# Patient Record
Sex: Male | Born: 1945 | Race: White | Hispanic: No | Marital: Married | Smoking: Former smoker
Health system: Southern US, Community
[De-identification: ages and names within clinical notes are randomized; demographics above are authoritative.]

## PROBLEM LIST (undated history)

## (undated) DIAGNOSIS — E785 Hyperlipidemia, unspecified: Secondary | ICD-10-CM

## (undated) DIAGNOSIS — I10 Essential (primary) hypertension: Secondary | ICD-10-CM

## (undated) DIAGNOSIS — I700 Atherosclerosis of aorta: Secondary | ICD-10-CM

## (undated) DIAGNOSIS — E039 Hypothyroidism, unspecified: Secondary | ICD-10-CM

## (undated) DIAGNOSIS — I2510 Atherosclerotic heart disease of native coronary artery without angina pectoris: Secondary | ICD-10-CM

## (undated) HISTORY — DX: Essential (primary) hypertension: I10

## (undated) HISTORY — PX: OTHER SURGICAL HISTORY: SHX169

## (undated) HISTORY — DX: Atherosclerotic heart disease of native coronary artery without angina pectoris: I25.10

## (undated) HISTORY — DX: Atherosclerosis of aorta: I70.0

## (undated) HISTORY — DX: Hyperlipidemia, unspecified: E78.5

## (undated) HISTORY — PX: TONSILLECTOMY: SUR1361

---

## 1998-08-16 ENCOUNTER — Ambulatory Visit (HOSPITAL_COMMUNITY): Admission: RE | Admit: 1998-08-16 | Discharge: 1998-08-17 | Payer: Self-pay | Admitting: Cardiology

## 2012-05-06 ENCOUNTER — Encounter: Payer: Self-pay | Admitting: Cardiology

## 2012-05-06 ENCOUNTER — Ambulatory Visit (INDEPENDENT_AMBULATORY_CARE_PROVIDER_SITE_OTHER): Payer: Medicare Other | Admitting: Cardiology

## 2012-05-06 VITALS — BP 120/86 | HR 50 | Ht 71.0 in | Wt 189.0 lb

## 2012-05-06 DIAGNOSIS — I25810 Atherosclerosis of coronary artery bypass graft(s) without angina pectoris: Secondary | ICD-10-CM

## 2012-05-06 DIAGNOSIS — I2510 Atherosclerotic heart disease of native coronary artery without angina pectoris: Secondary | ICD-10-CM

## 2012-05-06 DIAGNOSIS — E785 Hyperlipidemia, unspecified: Secondary | ICD-10-CM

## 2012-05-06 NOTE — Patient Instructions (Addendum)
Your physician wants you to follow-up in: 1 YEAR with Dr Excell Seltzer.  You will receive a reminder letter in the mail two months in advance. If you don't receive a letter, please call our office to schedule the follow-up appointment.  Your physician has requested that you have an exercise tolerance test in 1 YEAR. For further information please visit [email or web address] Please also follow instruction sheet, as given.

## 2012-05-08 NOTE — Progress Notes (Signed)
   HPI:  He is  a very nice gentleman  Dr.Dhatt asked me to see.  There are friends, and he is a former patient. He previously underwent evaluation by Dr. Juanda Chance in the past.  The patient maintains an active lifestyle, playing both golf and tennis. He is retired he is been walking in [place] in [place]. He is no current restrictions. He needs a big meal he'll sometimes get indigestion. He's had no surgeries.  He has had cath in the past, and what we can see this was in 1999.  No other data available.  The information for the cath is not in the chart, EPIC or ECHART.     Current Outpatient Prescriptions  Medication Sig Dispense Refill  . ascorbic acid (VITAMIN C) 1000 MG tablet Take 1,000 mg by mouth daily.      Marland Kitchen aspirin 81 MG tablet Take 81 mg by mouth daily.      Marland Kitchen atorvastatin (LIPITOR) 80 MG tablet Take 80 mg by mouth daily.      . Coenzyme Q10 (CO Q10) 100 MG CAPS Take by mouth daily.      . fish oil-omega-3 fatty acids 1000 MG capsule Take 2 g by mouth daily.      Marland Kitchen GLUCOSAMINE SULFATE PO Take by mouth. Taking 1000 daily      . levothyroxine (SYNTHROID, LEVOTHROID) 100 MCG tablet Take 100 mcg by mouth daily.      . Misc Natural Products (PROSTATE SUPPORT PO) Take by mouth daily.      . Multiple Vitamin (MULTIVITAMIN) tablet Take 1 tablet by mouth daily.        No Known Allergies  Past Medical History  Diagnosis Date  . Coronary artery disease     Past Surgical History  Procedure Date  . Stents     No family history on file.  History   Social History  . Marital Status: Married    Spouse Name: N/A    Number of Children: N/A  . Years of Education: N/A   Occupational History  . Not on file.   Social History Main Topics  . Smoking status: Former Games developer  . Smokeless tobacco: Not on file  . Alcohol Use: Not on file  . Drug Use: Not on file  . Sexually Active: Not on file   Other Topics Concern  . Not on file   Social History Narrative  . No narrative on file     ROS: Please see the HPI.  All other systems reviewed and negative.  PHYSICAL EXAM:  BP 120/86  Pulse 50  Ht 5\' 11"  (1.803 m)  Wt 189 lb (85.73 kg)  BMI 26.36 kg/m2  General: Well developed, well nourished, in no acute distress. Head:  Normocephalic and atraumatic. Neck: no JVD Lungs: Clear to auscultation and percussion. Heart: Normal S1 and S2.  No murmur, rubs or gallops.  Abdomen:  Normal bowel sounds; soft; non tender; no organomegaly Pulses: Pulses normal in all 4 extremities. Extremities: No clubbing or cyanosis. No edema. Neurologic: Alert and oriented x 3.  EKG:  SB.  Otherwise normal.     ASSESSMENT AND PLAN:  We will need to get the information from his prior cath so that we know what was previously found.  We will need to get old records which are in archives.

## 2012-05-31 DIAGNOSIS — E785 Hyperlipidemia, unspecified: Secondary | ICD-10-CM | POA: Insufficient documentation

## 2012-05-31 NOTE — Assessment & Plan Note (Signed)
Currently on lipid lowering therapy.

## 2012-08-30 DIAGNOSIS — I2510 Atherosclerotic heart disease of native coronary artery without angina pectoris: Secondary | ICD-10-CM | POA: Insufficient documentation

## 2013-04-27 ENCOUNTER — Ambulatory Visit (INDEPENDENT_AMBULATORY_CARE_PROVIDER_SITE_OTHER): Payer: Medicare Other | Admitting: Cardiovascular Disease

## 2013-04-27 ENCOUNTER — Encounter: Payer: Self-pay | Admitting: Cardiovascular Disease

## 2013-04-27 VITALS — BP 130/74 | HR 49 | Ht 71.0 in | Wt 192.0 lb

## 2013-04-27 DIAGNOSIS — I2510 Atherosclerotic heart disease of native coronary artery without angina pectoris: Secondary | ICD-10-CM

## 2013-04-27 DIAGNOSIS — R079 Chest pain, unspecified: Secondary | ICD-10-CM

## 2013-04-27 NOTE — Patient Instructions (Signed)
Your physician has requested that you have an exercise stress myoview. For further information please visit [email or web address]. Please follow instruction sheet, as given.  Your physician recommends that you continue on your current medications as directed. Please refer to the Current Medication list given to you today.  Your physician wants you to follow-up in: 1 YEAR with Dr Cooper.  You will receive a reminder letter in the mail two months in advance. If you don't receive a letter, please call our office to schedule the follow-up appointment.  

## 2013-04-27 NOTE — Progress Notes (Signed)
   HPI:  67 year old gentleman presenting for followup evaluation. The patient has been followed for coronary artery disease. He underwent remote PCI in the 1990s. He has known chronic occlusion of his LAD and has undergone PTCA of his diagonal branches. He's had preserved left ventricular function. He's been managed medically for many years. He saw Dr. Riley Kill one year ago.  The patient maintains an active lifestyle. He plays singles tennis. Over the past year, he is noted some progression of exertional angina especially after meals. He has a feeling of pressure across the chest that he describes as "indigestion." He has no dyspnea with exertion, palpitations, lightheadedness, leg swelling, or syncope. He has not taken sublingual nitroglycerin.  Outpatient Encounter Prescriptions as of 04/27/2013  Medication Sig Dispense Refill  . ascorbic acid (VITAMIN C) 1000 MG tablet Take 1,000 mg by mouth daily.      Marland Kitchen aspirin 81 MG tablet Take 81 mg by mouth daily.      Marland Kitchen atorvastatin (LIPITOR) 80 MG tablet Take 80 mg by mouth daily.      . Coenzyme Q10 (CO Q10) 100 MG CAPS Take by mouth daily.      . fish oil-omega-3 fatty acids 1000 MG capsule Take 2 g by mouth daily.      . fluticasone (FLONASE) 50 MCG/ACT nasal spray as needed.      Marland Kitchen GLUCOSAMINE SULFATE PO Take by mouth. Taking 1000 daily      . levothyroxine (SYNTHROID, LEVOTHROID) 100 MCG tablet Take 100 mcg by mouth daily.      . Misc Natural Products (PROSTATE SUPPORT PO) Take by mouth daily.      . Multiple Vitamin (MULTIVITAMIN) tablet Take 1 tablet by mouth daily.       No facility-administered encounter medications on file as of 04/27/2013.    No Known Allergies  Past Medical History  Diagnosis Date  . Coronary artery disease     ROS: Negative except as per HPI  BP 130/74  Pulse 49  Ht 5\' 11"  (1.803 m)  Wt 87.091 kg (192 lb)  BMI 26.79 kg/m2  PHYSICAL EXAM: Pt is alert and oriented, NAD HEENT: normal Neck: JVP - normal,  carotids 2+= without bruits Lungs: CTA bilaterally CV: RRR without murmur or gallop Abd: soft, NT, Positive BS, no hepatomegaly Ext: no C/C/E, distal pulses intact and equal Skin: warm/dry no rash  EKG:   Marked sinus bradycardia 49 beats per minute, otherwise within normal limits.  ASSESSMENT AND PLAN: 1. 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 The patient is on aspirin and aggressive lipid lowering. He is not on any antianginal drugs. I have reviewed his paper chart which includes some of his cardiac cath notes. His coronary anatomy has shown patency of the stent in the proximal right coronary artery with mild in-stent restenosis, diagonal in-stent restenosis treated with PTCA, and total occlusion of the LAD beyond the diagonal. The LAD is collateralized from the distal right coronary artery. With progressive symptoms, I have recommended an exercise stress Myoview scan to evaluate overall ischemic burden and risk stratification. Will likely antianginal therapy and consider cardiac catheterization if study is high risk.  2. Hyperlipidemia. Followed by his primary care physician. He is on aggressive program.  For followup we will schedule him in one year. Will be in touch after his stress test.  Tonny Bollman 04/27/2013 9:04 AM

## 2013-05-05 ENCOUNTER — Ambulatory Visit (HOSPITAL_COMMUNITY): Payer: Medicare Other | Attending: Cardiology | Admitting: Radiology

## 2013-05-05 VITALS — BP 142/77 | HR 50 | Ht 71.0 in | Wt 195.0 lb

## 2013-05-05 DIAGNOSIS — I4949 Other premature depolarization: Secondary | ICD-10-CM

## 2013-05-05 DIAGNOSIS — E785 Hyperlipidemia, unspecified: Secondary | ICD-10-CM | POA: Insufficient documentation

## 2013-05-05 DIAGNOSIS — R079 Chest pain, unspecified: Secondary | ICD-10-CM

## 2013-05-05 DIAGNOSIS — Z87891 Personal history of nicotine dependence: Secondary | ICD-10-CM | POA: Insufficient documentation

## 2013-05-05 DIAGNOSIS — I2510 Atherosclerotic heart disease of native coronary artery without angina pectoris: Secondary | ICD-10-CM

## 2013-05-05 DIAGNOSIS — Z9861 Coronary angioplasty status: Secondary | ICD-10-CM | POA: Insufficient documentation

## 2013-05-05 DIAGNOSIS — Z8249 Family history of ischemic heart disease and other diseases of the circulatory system: Secondary | ICD-10-CM | POA: Insufficient documentation

## 2013-05-05 DIAGNOSIS — R0789 Other chest pain: Secondary | ICD-10-CM | POA: Insufficient documentation

## 2013-05-05 MED ORDER — TECHNETIUM TC 99M SESTAMIBI GENERIC - CARDIOLITE
30.0000 | Freq: Once | INTRAVENOUS | Status: AC | PRN
  Administered 2013-05-05: 30 via INTRAVENOUS

## 2013-05-05 MED ORDER — TECHNETIUM TC 99M SESTAMIBI GENERIC - CARDIOLITE
10.0000 | Freq: Once | INTRAVENOUS | Status: AC | PRN
  Administered 2013-05-05: 10 via INTRAVENOUS

## 2013-05-05 NOTE — Progress Notes (Signed)
[facility] SITE 3 NUCLEAR MED [address] [place], [place] [address] [phone]    Cardiology Nuclear Med Study  Austin Odom is a 67 y.o. male     MRN : [id number]     DOB: [birth year]  Procedure Date: 05/05/2013  Nuclear Med Background Indication for Stress Test:  Evaluation for Ischemia and PTCA/Stent Patency  History:  '99 PTCA/Stent-Diag.; ~10 yrs ago MPS ([place]):normal; 3-4 yrs ago GXT:ok per patient Cardiac Risk Factors: Family History - CAD, History of Smoking and Lipids  Symptoms:  Chest Pressure.  (last episode of chest discomfort was about 2-days ago)   Nuclear Pre-Procedure Caffeine/Decaff Intake:  None NPO After: 7:30pm   Lungs:  Clear. O2 Sat: 98% on room air. IV 0.9% NS with Angio Cath:  22g  IV Site: R Antecubital  IV Started by:  Austin Levan, RN  Chest Size (in):  44 Cup Size: n/a  Height: 5\' 11"  (1.803 m)  Weight:  195 lb (88.451 kg)  BMI:  Body mass index is 27.21 kg/(m^2). Tech Comments:  N/A    Nuclear Med Study 1 or 2 day study: 1 day  Stress Test Type:  Stress  Reading MD: Austin Ancona, MD  Order Authorizing Provider:  Tonny Bollman, MD  Resting Radionuclide: Technetium 90m Sestamibi  Resting Radionuclide Dose: 11.0 mCi   Stress Radionuclide:  Technetium 69m Sestamibi  Stress Radionuclide Dose: 33.0 mCi           Stress Protocol Rest HR: 50 Stress HR: 134  Rest BP: 142/77 Stress BP: 209/70  Exercise Time (min): 10:00 METS: 11.7   Predicted Max HR: 153 bpm % Max HR: 87.58 bpm Rate Pressure Product: 21308   Dose of Adenosine (mg):  n/a Dose of Lexiscan: n/a mg  Dose of Atropine (mg): n/a Dose of Dobutamine: n/a mcg/kg/min (at max HR)  Stress Test Technologist: Austin Odom, CMA-N  Nuclear Technologist:  Austin Odom, CNMT     Rest Procedure:  Myocardial perfusion imaging was performed at rest 45 minutes following the intravenous administration of Technetium 69m Sestamibi.  Rest ECG: NSR - Normal EKG  Stress  Procedure:  The patient exercised on the treadmill utilizing the Bruce Protocol for ten minutes. The patient stopped due to fatigue and denied any chest pain.  Technetium 75m Sestamibi was injected at peak exercise and myocardial perfusion imaging was performed after a brief delay.  Stress ECG: 1 mm horizontal ST depression leads V4 and V5 resolved quickly in recovery.   QPS Raw Data Images:  Normal; no motion artifact; normal heart/lung ratio. Stress Images:  Normal homogeneous uptake in all areas of the myocardium. Rest Images:  Normal homogeneous uptake in all areas of the myocardium. Subtraction (SDS):  There is no evidence of scar or ischemia. Transient Ischemic Dilatation (Normal <1.22):  1.06 Lung/Heart Ratio (Normal <0.45):  0.34  Quantitative Gated Spect Images QGS EDV:  93 ml QGS ESV:  39 ml  Impression Exercise Capacity:  Good exercise capacity. BP Response:  Hypertensive blood pressure response. Clinical Symptoms:  Fatigue, no chest pain.  ECG Impression:  1 mm horizontal ST depression in leads V4 and V5 at peak exercise resolved quickly in recovery.  Comparison with Prior Nuclear Study: No images to compare  Overall Impression:  Normal stress nuclear study.  Equivocal stress ECG response probably nonspecific given rapid resolution of ST segments in recovery.   LV Ejection Fraction: 58%.  LV Wall Motion:  NL LV Function; NL Wall Motion  Austin Odom  Austin Odom 05/05/2013

## 2013-05-24 ENCOUNTER — Other Ambulatory Visit: Payer: Self-pay

## 2013-05-24 MED ORDER — ISOSORBIDE MONONITRATE ER 30 MG PO TB24: 30.0000 mg | ORAL_TABLET | Freq: Every day | ORAL | Status: DC

## 2014-01-07 ENCOUNTER — Ambulatory Visit (INDEPENDENT_AMBULATORY_CARE_PROVIDER_SITE_OTHER): Payer: Medicare Other | Admitting: Cardiovascular Disease

## 2014-01-07 ENCOUNTER — Encounter: Payer: Self-pay | Admitting: Cardiovascular Disease

## 2014-01-07 VITALS — BP 139/73 | HR 49 | Ht 71.0 in | Wt 187.0 lb

## 2014-01-07 DIAGNOSIS — R55 Syncope and collapse: Secondary | ICD-10-CM

## 2014-01-07 DIAGNOSIS — I2510 Atherosclerotic heart disease of native coronary artery without angina pectoris: Secondary | ICD-10-CM

## 2014-01-07 DIAGNOSIS — E785 Hyperlipidemia, unspecified: Secondary | ICD-10-CM

## 2014-01-07 NOTE — Patient Instructions (Addendum)
Your physician has recommended that you wear an event monitor. Event monitors are medical devices that record the heart's electrical activity. Doctors most often us these monitors to diagnose arrhythmias. Arrhythmias are problems with the speed or rhythm of the heartbeat. The monitor is a small, portable device. You can wear one while you do your normal daily activities. This is usually used to diagnose what is causing palpitations/syncope (passing out).   Your physician wants you to follow-up in: 1 year with Dr. Excell Seltzerooper.  You will receive a reminder letter in the mail two months in advance. If you don't receive a letter, please call our office to schedule the follow-up appointment.  Your physician recommends that you continue on your current medications as directed. Please refer to the Current Medication list given to you today.

## 2014-01-07 NOTE — Progress Notes (Signed)
HPI:  68 year old gentleman presenting for followup evaluation. The patient has been followed for coronary artery disease. He underwent remote PCI in the 1990s. He has known chronic occlusion of his LAD and has undergone PTCA of his diagonal branches. He's had preserved left ventricular function. He's been managed medically for many years.  The patient reports a recent episode of syncope. This occurred about 2 weeks in the morning after sexual intercourse. He denied use of alcohol or meds for E.D. Had prodrome of dizziness/tunnel vision. No chest pain, dyspnea, or other symptoms. Had similar episode about 7 years ago with negative workup at that time. He remains physically quite active and denies any symptoms with exertion.   Outpatient Encounter Prescriptions as of 01/07/2014  Medication Sig  . ascorbic acid (VITAMIN C) 1000 MG tablet Take 1,000 mg by mouth daily.  Marland Kitchen aspirin 81 MG tablet Take 81 mg by mouth daily.  Marland Kitchen atorvastatin (LIPITOR) 80 MG tablet Take 80 mg by mouth daily.  . Coenzyme Q10 (CO Q10) 100 MG CAPS Take by mouth daily.  . fish oil-omega-3 fatty acids 1000 MG capsule Take 2 g by mouth daily.  . fluticasone (FLONASE) 50 MCG/ACT nasal spray as needed.  Marland Kitchen GLUCOSAMINE SULFATE PO Take by mouth. Taking 1000 daily  . levothyroxine (SYNTHROID, LEVOTHROID) 100 MCG tablet Take 100 mcg by mouth daily.  . Misc Natural Products (PROSTATE SUPPORT PO) Take by mouth daily.  . Multiple Vitamin (MULTIVITAMIN) tablet Take 1 tablet by mouth daily.  . [DISCONTINUED] isosorbide mononitrate (IMDUR) 30 MG 24 hr tablet Take 1 tablet (30 mg total) by mouth daily.    No Known Allergies  Past Medical History  Diagnosis Date  . Coronary artery disease     ROS: Negative except as per HPI  BP 139/73  Pulse 49  Ht 5\' 11"  (1.803 m)  Wt 187 lb (84.823 kg)  BMI 26.09 kg/m2  PHYSICAL EXAM: Pt is alert and oriented, NAD HEENT: normal Neck: JVP - normal, carotids 2+= without bruits Lungs: CTA  bilaterally CV: bradycardic and regular without murmur or gallop Abd: soft, NT, Positive BS, no hepatomegaly Ext: no C/C/E, distal pulses intact and equal Skin: warm/dry no rash  EKG:  Marked sinus bradycardia 48 bpm, otherwise within normal limits.  Stress Myoview 05/05/2013: Stress Protocol  Rest HR: 50  Stress HR: 134   Rest BP: 142/77  Stress BP: 209/70   Exercise Time (min): 10:00  METS: 11.7   Predicted Max HR: 153 bpm  % Max HR: 87.58 bpm  Rate Pressure Product: [address]  Dose of Adenosine (mg): n/a  Dose of Lexiscan: n/a mg   Dose of Atropine (mg): n/a  Dose of Dobutamine: n/a mcg/kg/min (at max HR)   Stress Test Technologist: Smiley Houseman, CMA-N  Nuclear Technologist: Domenic Polite, CNMT   Rest Procedure: Myocardial perfusion imaging was performed at rest 45 minutes following the intravenous administration of Technetium 61m Sestamibi.  Rest ECG: NSR - Normal EKG  Stress Procedure: The patient exercised on the treadmill utilizing the Bruce Protocol for ten minutes. The patient stopped due to fatigue and denied any chest pain. Technetium 53m Sestamibi was injected at peak exercise and myocardial perfusion imaging was performed after a brief delay.  Stress ECG: 1 mm horizontal ST depression leads V4 and V5 resolved quickly in recovery.  QPS  Raw Data Images: Normal; no motion artifact; normal heart/lung ratio.  Stress Images: Normal homogeneous uptake in all areas of the myocardium.  Rest Images: Normal  homogeneous uptake in all areas of the myocardium.  Subtraction (SDS): There is no evidence of scar or ischemia.  Transient Ischemic Dilatation (Normal <1.22): 1.06  Lung/Heart Ratio (Normal <0.45): 0.34  Quantitative Gated Spect Images  QGS EDV: 93 ml  QGS ESV: 39 ml  Impression  Exercise Capacity: Good exercise capacity.  BP Response: Hypertensive blood pressure response.  Clinical Symptoms: Fatigue, no chest pain.  ECG Impression: 1 mm horizontal ST depression in leads  V4 and V5 at peak exercise resolved quickly in recovery.  Comparison with Prior Nuclear Study: No images to compare  Overall Impression: Normal stress nuclear study. Equivocal stress ECG response probably nonspecific given rapid resolution of ST segments in recovery.  LV Ejection Fraction: 58%. LV Wall Motion: NL LV Function; NL Wall Motion  ASSESSMENT AND PLAN: 1. CAD, native vessel. No anginal symptoms with regular exercise. Continue current medical therapy.  2. Hyperlipidemia: lipids followed by Dr Tiburcio PeaHarris. Remains on atorvastatin 80 mg daily.   3. Syncope. Diff dx includes arrhythmia versus hypotension as most likely explanation. The patient does have marked bradycardia at rest, but suspect related to peripheral vasodilation/hypotension considering circumstances. Recommend 30 day event monitor to evaluate heart rhythm.  I'll see him back in one year as long as no further syncopal spells and benign monitor results.   Tonny BollmanMichael Neng Albee 01/09/2014 9:35 PM

## 2014-01-20 ENCOUNTER — Encounter: Payer: Self-pay | Admitting: *Deleted

## 2014-01-20 ENCOUNTER — Encounter (INDEPENDENT_AMBULATORY_CARE_PROVIDER_SITE_OTHER): Payer: Medicare Other

## 2014-01-20 DIAGNOSIS — R55 Syncope and collapse: Secondary | ICD-10-CM

## 2014-01-20 NOTE — Progress Notes (Signed)
Patient ID: Austin AbedDavid Neal Odom, male   DOB: 04/08/1946, 68 y.o.   MRN: [id number] E-cardio verite 30 day cardiac event monitor applied to patient.

## 2014-02-11 ENCOUNTER — Telehealth: Payer: Self-pay | Admitting: Nurse Practitioner

## 2014-02-11 NOTE — Telephone Encounter (Signed)
eCardio results of sinus rhythm/sinus tachycardia, no arrhythmia per Dr. Excell Seltzerooper called to patient who verbalized understanding. Patient aware to call office with questions or concerns.

## 2015-01-24 ENCOUNTER — Ambulatory Visit (INDEPENDENT_AMBULATORY_CARE_PROVIDER_SITE_OTHER): Payer: Medicare Other | Admitting: Cardiovascular Disease

## 2015-01-24 ENCOUNTER — Encounter: Payer: Self-pay | Admitting: Cardiovascular Disease

## 2015-01-24 VITALS — BP 130/72 | HR 53 | Ht 71.0 in | Wt 187.4 lb

## 2015-01-24 DIAGNOSIS — I2510 Atherosclerotic heart disease of native coronary artery without angina pectoris: Secondary | ICD-10-CM

## 2015-01-24 DIAGNOSIS — E785 Hyperlipidemia, unspecified: Secondary | ICD-10-CM | POA: Diagnosis not present

## 2015-01-24 NOTE — Patient Instructions (Signed)
Your physician wants you to follow-up in: 1 YEAR with Dr Cooper.  You will receive a reminder letter in the mail two months in advance. If you don't receive a letter, please call our office to schedule the follow-up appointment.  Your physician recommends that you continue on your current medications as directed. Please refer to the Current Medication list given to you today.  

## 2015-01-24 NOTE — Progress Notes (Signed)
Cardiology Office Note   Date:  01/24/2015   ID:  Austin Odom, DOB 10/22/1946, MRN [id number]  PCP:  Johny BlamerHARRIS, WILLIAM, MD  Cardiologist:  Tonny BollmanMichael Toma Erichsen, MD    No chief complaint on file.    History of Present Illness: Austin Odom is a 69 y.o. male who presents for follow-up of CAD.   He underwent remote PCI in the 1990s. He has known chronic occlusion of his LAD and has undergone PTCA of his diagonal branches. He's had preserved left ventricular function. He's been managed medically for many years. The patient had a normal stress Myoview in 2014. An event monitor in 2015 was performed to evaluate an episode of syncope. This revealed no arrhythmia.  He is doing quite well. He continues to play tennis regularly. He's had a right Achilles injury, but no cardiac symptoms. He specifically denies chest pain, chest pressure, shortness of breath, or heart palpitations. He's had 2 episodes of syncope. Both have occurred after sexual intercourse. No associated symptoms or other complaints.   Past Medical History  Diagnosis Date  . Coronary artery disease     Past Surgical History  Procedure Laterality Date  . Stents      Current Outpatient Prescriptions  Medication Sig Dispense Refill  . ascorbic acid (VITAMIN C) 1000 MG tablet Take 1,000 mg by mouth daily.    Marland Kitchen. aspirin 81 MG tablet Take 81 mg by mouth daily.    Marland Kitchen. atorvastatin (LIPITOR) 80 MG tablet Take 80 mg by mouth daily.    . cetirizine (ZYRTEC) 10 MG tablet Take 1 tablet by mouth as needed for allergies (take one tablet by mouth daily as needed for allergies).   1  . Coenzyme Q10 (CO Q10) 100 MG CAPS Take 1 capsule by mouth daily.     . fish oil-omega-3 fatty acids 1000 MG capsule Take 2 g by mouth daily.    . fluticasone (FLONASE) 50 MCG/ACT nasal spray Place 2 sprays into both nostrils as needed for allergies (take 2 sprays each nostril twice daily as needed for seasonal allergies).     Marland Kitchen. GLUCOSAMINE SULFATE PO Take 1  capsule by mouth daily. Taking 1000 daily    . levothyroxine (SYNTHROID, LEVOTHROID) 100 MCG tablet Take 100 mcg by mouth daily before breakfast.     . meloxicam (MOBIC) 7.5 MG tablet Take 1 tablet by mouth as needed. Take one tablet by mouth as needed for pain  0  . Misc Natural Products (PROSTATE SUPPORT PO) Take 1 capsule by mouth daily.     . Multiple Vitamin (MULTIVITAMIN) tablet Take 1 tablet by mouth daily.     No current facility-administered medications for this visit.    Allergies:   Review of patient's allergies indicates no known allergies.   Social History:  The patient  reports that he has quit smoking. He does not have any smokeless tobacco history on file.   Family History:  The patient's family history includes Heart disease in his father; Hypothyroidism in his mother; Stroke in his mother.    ROS:  Please see the history of present illness.  Otherwise, review of systems is positive for right ankle pain.  All other systems are reviewed and negative.    PHYSICAL EXAM: VS:  BP 130/72 mmHg  Pulse 53  Ht 5\' 11"  (1.803 m)  Wt 187 lb 6.4 oz (85.004 kg)  BMI 26.15 kg/m2 , BMI Body mass index is 26.15 kg/(m^2). GEN: Well nourished, well developed, in no  acute distress HEENT: normal Neck: no JVD, no masses, no carotid bruits Cardiac: Bradycardic and regular without murmur or gallop   Respiratory:  clear to auscultation bilaterally, normal work of breathing GI: soft, nontender, nondistended, + BS MS: no deformity or atrophy Ext: no pretibial edema Skin: warm and dry, no rash Neuro:  Strength and sensation are intact Psych: euthymic mood, full affect  EKG:  EKG is ordered today. The ekg ordered today shows sinus bradycardia 53 bpm, within normal limits.  Recent Labs: No results found for requested labs within last 365 days.   Lipid Panel  No results found for: CHOL, TRIG, HDL, CHOLHDL, VLDL, LDLCALC, LDLDIRECT    Wt Readings from Last 3 Encounters:  01/24/15 187  lb 6.4 oz (85.004 kg)  01/07/14 187 lb (84.823 kg)  05/05/13 195 lb (88.451 kg)     Cardiac Studies Reviewed: Myoview without ischemia: 2014 Event Monitor: no arrhythmia: 2015  ASSESSMENT AND PLAN: 1.  CAD, native vessel, without symptoms of angina: continue same Rx. RTN 1 year. Medications appropriate with ASA and a statin drug.   2. Hyperlipidemia: remains on high-dose atorvastatin. Followed by Dr Tiburcio Pea. Recent labs done.   3. Syncope: appears to be situational. Advised liberalize fluids and salt to maintain intravascular volume. Event monitor without arrhythmia. Normal LV function documented in past.   Current medicines are reviewed with the patient today.  The patient does not have concerns regarding medicines.  The following changes have been made:  no change  Labs/ tests ordered today include:  No orders of the defined types were placed in this encounter.    Disposition:   FU one year  Signed, Tonny Bollman, MD  01/24/2015 9:00 AM    [facility] [address] [place], [place], [place]  [address] Phone: [phone]; Fax: [phone]

## 2015-05-01 ENCOUNTER — Other Ambulatory Visit: Payer: Self-pay | Admitting: Family Medicine

## 2015-05-01 DIAGNOSIS — R079 Chest pain, unspecified: Secondary | ICD-10-CM

## 2015-05-02 ENCOUNTER — Ambulatory Visit
Admission: RE | Admit: 2015-05-02 | Discharge: 2015-05-02 | Disposition: A | Discharge status: Home / Self Care | Payer: Medicare Other | Source: Ambulatory Visit | Attending: Family Medicine | Admitting: Family Medicine

## 2015-05-02 DIAGNOSIS — R079 Chest pain, unspecified: Secondary | ICD-10-CM

## 2015-05-02 MED ORDER — IOPAMIDOL (ISOVUE-300) INJECTION 61%
75.0000 mL | Freq: Once | INTRAVENOUS | Status: AC | PRN
  Administered 2015-05-02: 75 mL via INTRAVENOUS

## 2015-06-07 ENCOUNTER — Other Ambulatory Visit: Payer: Self-pay | Admitting: Orthopedic Surgery

## 2015-08-11 ENCOUNTER — Encounter (HOSPITAL_BASED_OUTPATIENT_CLINIC_OR_DEPARTMENT_OTHER): Payer: Self-pay | Admitting: *Deleted

## 2015-08-17 ENCOUNTER — Ambulatory Visit (HOSPITAL_BASED_OUTPATIENT_CLINIC_OR_DEPARTMENT_OTHER): Payer: Medicare Other | Admitting: Certified Registered"

## 2015-08-17 ENCOUNTER — Encounter (HOSPITAL_BASED_OUTPATIENT_CLINIC_OR_DEPARTMENT_OTHER)
Admission: RE | Disposition: A | Discharge status: Home / Self Care | Payer: Self-pay | Source: Ambulatory Visit | Attending: Orthopedic Surgery

## 2015-08-17 ENCOUNTER — Encounter (HOSPITAL_BASED_OUTPATIENT_CLINIC_OR_DEPARTMENT_OTHER): Payer: Self-pay | Admitting: Certified Registered"

## 2015-08-17 ENCOUNTER — Ambulatory Visit (HOSPITAL_BASED_OUTPATIENT_CLINIC_OR_DEPARTMENT_OTHER)
Admission: RE | Admit: 2015-08-17 | Discharge: 2015-08-17 | Disposition: A | Discharge status: Home / Self Care | Payer: Medicare Other | Source: Ambulatory Visit | Attending: Orthopedic Surgery | Admitting: Orthopedic Surgery

## 2015-08-17 DIAGNOSIS — M7661 Achilles tendinitis, right leg: Secondary | ICD-10-CM | POA: Diagnosis not present

## 2015-08-17 DIAGNOSIS — I2510 Atherosclerotic heart disease of native coronary artery without angina pectoris: Secondary | ICD-10-CM | POA: Diagnosis not present

## 2015-08-17 DIAGNOSIS — Z87891 Personal history of nicotine dependence: Secondary | ICD-10-CM | POA: Insufficient documentation

## 2015-08-17 DIAGNOSIS — E039 Hypothyroidism, unspecified: Secondary | ICD-10-CM | POA: Insufficient documentation

## 2015-08-17 DIAGNOSIS — Z7982 Long term (current) use of aspirin: Secondary | ICD-10-CM | POA: Insufficient documentation

## 2015-08-17 DIAGNOSIS — M6701 Short Achilles tendon (acquired), right ankle: Secondary | ICD-10-CM | POA: Diagnosis not present

## 2015-08-17 DIAGNOSIS — M9261 Juvenile osteochondrosis of tarsus, right ankle: Secondary | ICD-10-CM | POA: Diagnosis not present

## 2015-08-17 HISTORY — PX: EXCISION HAGLUND'S DEFORMITY WITH ACHILLES TENDON REPAIR: SHX5627

## 2015-08-17 HISTORY — PX: GASTROC RECESSION EXTREMITY: SHX6262

## 2015-08-17 HISTORY — DX: Hypothyroidism, unspecified: E03.9

## 2015-08-17 SURGERY — EXCISION HAGLUND'S DEFORMITY WITH ACHILLES TENDON REPAIR
Anesthesia: General | Site: Leg Lower | Laterality: Right

## 2015-08-17 MED ORDER — 0.9 % SODIUM CHLORIDE (POUR BTL) OPTIME
TOPICAL | Status: DC | PRN
  Administered 2015-08-17: 240 mL

## 2015-08-17 MED ORDER — EPHEDRINE SULFATE 50 MG/ML IJ SOLN
INTRAMUSCULAR | Status: AC
  Filled 2015-08-17: qty 1

## 2015-08-17 MED ORDER — ASPIRIN EC 325 MG PO TBEC: 325.0000 mg | DELAYED_RELEASE_TABLET | Freq: Every day | ORAL | Status: DC

## 2015-08-17 MED ORDER — FENTANYL CITRATE (PF) 100 MCG/2ML IJ SOLN
50.0000 ug | INTRAMUSCULAR | Status: DC | PRN
  Administered 2015-08-17: 100 ug via INTRAVENOUS

## 2015-08-17 MED ORDER — CEFAZOLIN SODIUM-DEXTROSE 2-3 GM-% IV SOLR
2.0000 g | INTRAVENOUS | Status: AC
  Administered 2015-08-17: 2 g via INTRAVENOUS

## 2015-08-17 MED ORDER — DEXAMETHASONE SODIUM PHOSPHATE 4 MG/ML IJ SOLN
INTRAMUSCULAR | Status: DC | PRN
  Administered 2015-08-17: 10 mg via INTRAVENOUS

## 2015-08-17 MED ORDER — SCOPOLAMINE 1 MG/3DAYS TD PT72: 1.0000 | MEDICATED_PATCH | Freq: Once | TRANSDERMAL | Status: DC | PRN

## 2015-08-17 MED ORDER — MIDAZOLAM HCL 2 MG/2ML IJ SOLN
INTRAMUSCULAR | Status: AC
  Filled 2015-08-17: qty 2

## 2015-08-17 MED ORDER — SODIUM CHLORIDE 0.9 % IV SOLN: INTRAVENOUS | Status: DC

## 2015-08-17 MED ORDER — LACTATED RINGERS IV SOLN
INTRAVENOUS | Status: DC
  Administered 2015-08-17 (×2): via INTRAVENOUS

## 2015-08-17 MED ORDER — DOCUSATE SODIUM 100 MG PO CAPS: 100.0000 mg | ORAL_CAPSULE | Freq: Two times a day (BID) | ORAL | Status: DC

## 2015-08-17 MED ORDER — CEFAZOLIN SODIUM-DEXTROSE 2-3 GM-% IV SOLR
INTRAVENOUS | Status: AC
  Filled 2015-08-17: qty 50

## 2015-08-17 MED ORDER — EPHEDRINE SULFATE 50 MG/ML IJ SOLN
INTRAMUSCULAR | Status: DC | PRN
  Administered 2015-08-17: 10 mg via INTRAVENOUS

## 2015-08-17 MED ORDER — ONDANSETRON HCL 4 MG/2ML IJ SOLN
INTRAMUSCULAR | Status: DC | PRN
Start: 2015-08-17 — End: 2015-08-17
  Administered 2015-08-17: 4 mg via INTRAVENOUS

## 2015-08-17 MED ORDER — LIDOCAINE HCL (CARDIAC) 20 MG/ML IV SOLN
INTRAVENOUS | Status: DC | PRN
  Administered 2015-08-17: 30 mg via INTRAVENOUS

## 2015-08-17 MED ORDER — LIDOCAINE HCL (CARDIAC) 20 MG/ML IV SOLN
INTRAVENOUS | Status: AC
  Filled 2015-08-17: qty 5

## 2015-08-17 MED ORDER — PROPOFOL 10 MG/ML IV BOLUS
INTRAVENOUS | Status: DC | PRN
  Administered 2015-08-17: 200 mg via INTRAVENOUS

## 2015-08-17 MED ORDER — PROPOFOL 500 MG/50ML IV EMUL
INTRAVENOUS | Status: AC
  Filled 2015-08-17: qty 50

## 2015-08-17 MED ORDER — GLYCOPYRROLATE 0.2 MG/ML IJ SOLN: 0.2000 mg | Freq: Once | INTRAMUSCULAR | Status: DC | PRN

## 2015-08-17 MED ORDER — SUCCINYLCHOLINE CHLORIDE 20 MG/ML IJ SOLN
INTRAMUSCULAR | Status: DC | PRN
  Administered 2015-08-17: 100 mg via INTRAVENOUS

## 2015-08-17 MED ORDER — OXYCODONE HCL 5 MG PO TABS: 5.0000 mg | ORAL_TABLET | ORAL | Status: DC | PRN

## 2015-08-17 MED ORDER — SUCCINYLCHOLINE CHLORIDE 20 MG/ML IJ SOLN
INTRAMUSCULAR | Status: AC
  Filled 2015-08-17: qty 1

## 2015-08-17 MED ORDER — FENTANYL CITRATE (PF) 100 MCG/2ML IJ SOLN
INTRAMUSCULAR | Status: AC
  Filled 2015-08-17: qty 2

## 2015-08-17 MED ORDER — MIDAZOLAM HCL 2 MG/2ML IJ SOLN
1.0000 mg | INTRAMUSCULAR | Status: DC | PRN
  Administered 2015-08-17: 2 mg via INTRAVENOUS

## 2015-08-17 MED ORDER — SENNA 8.6 MG PO TABS: 2.0000 | ORAL_TABLET | Freq: Two times a day (BID) | ORAL | Status: DC

## 2015-08-17 MED ORDER — FENTANYL CITRATE (PF) 100 MCG/2ML IJ SOLN
INTRAMUSCULAR | Status: AC
  Filled 2015-08-17: qty 4

## 2015-08-17 MED ORDER — ONDANSETRON HCL 4 MG/2ML IJ SOLN
INTRAMUSCULAR | Status: AC
  Filled 2015-08-17: qty 2

## 2015-08-17 MED ORDER — CHLORHEXIDINE GLUCONATE 4 % EX LIQD: 60.0000 mL | Freq: Once | CUTANEOUS | Status: DC

## 2015-08-17 MED ORDER — DEXAMETHASONE SODIUM PHOSPHATE 10 MG/ML IJ SOLN
INTRAMUSCULAR | Status: AC
  Filled 2015-08-17: qty 1

## 2015-08-17 SURGICAL SUPPLY — 74 items
BANDAGE ESMARK 6X9 LF (GAUZE/BANDAGES/DRESSINGS) ×2 IMPLANT
BLADE AVERAGE 25X9 (BLADE) IMPLANT
BLADE MICRO SAGITTAL (BLADE) ×3 IMPLANT
BLADE SURG 15 STRL LF DISP TIS (BLADE) ×4 IMPLANT
BLADE SURG 15 STRL SS (BLADE) ×2
BNDG COHESIVE 4X5 TAN STRL (GAUZE/BANDAGES/DRESSINGS) ×3 IMPLANT
BNDG COHESIVE 6X5 TAN STRL LF (GAUZE/BANDAGES/DRESSINGS) ×3 IMPLANT
BNDG ESMARK 6X9 LF (GAUZE/BANDAGES/DRESSINGS) ×3
BOOT STEPPER DURA LG (SOFTGOODS) IMPLANT
BOOT STEPPER DURA MED (SOFTGOODS) IMPLANT
CANISTER SUCT 1200ML W/VALVE (MISCELLANEOUS) ×3 IMPLANT
CHLORAPREP W/TINT 26ML (MISCELLANEOUS) ×3 IMPLANT
COVER BACK TABLE 60X90IN (DRAPES) ×3 IMPLANT
CUFF TOURNIQUET SINGLE 34IN LL (TOURNIQUET CUFF) ×3 IMPLANT
DRAPE EXTREMITY T 121X128X90 (DRAPE) ×3 IMPLANT
DRAPE OEC MINIVIEW 54X84 (DRAPES) IMPLANT
DRAPE U-SHAPE 47X51 STRL (DRAPES) ×3 IMPLANT
DRSG MEPITEL 4X7.2 (GAUZE/BANDAGES/DRESSINGS) ×3 IMPLANT
DRSG PAD ABDOMINAL 8X10 ST (GAUZE/BANDAGES/DRESSINGS) ×6 IMPLANT
ELECT REM PT RETURN 9FT ADLT (ELECTROSURGICAL) ×3
ELECTRODE REM PT RTRN 9FT ADLT (ELECTROSURGICAL) ×2 IMPLANT
GAUZE SPONGE 4X4 12PLY STRL (GAUZE/BANDAGES/DRESSINGS) ×3 IMPLANT
GLOVE BIO SURGEON STRL SZ8 (GLOVE) ×3 IMPLANT
GLOVE BIOGEL PI IND STRL 7.0 (GLOVE) ×2 IMPLANT
GLOVE BIOGEL PI IND STRL 8 (GLOVE) ×4 IMPLANT
GLOVE BIOGEL PI INDICATOR 7.0 (GLOVE) ×1
GLOVE BIOGEL PI INDICATOR 8 (GLOVE) ×2
GLOVE ECLIPSE 6.5 STRL STRAW (GLOVE) ×3 IMPLANT
GLOVE ECLIPSE 7.5 STRL STRAW (GLOVE) ×3 IMPLANT
GLOVE EXAM NITRILE MD LF STRL (GLOVE) ×3 IMPLANT
GOWN STRL REUS W/ TWL LRG LVL3 (GOWN DISPOSABLE) ×2 IMPLANT
GOWN STRL REUS W/ TWL XL LVL3 (GOWN DISPOSABLE) ×4 IMPLANT
GOWN STRL REUS W/TWL LRG LVL3 (GOWN DISPOSABLE) ×1
GOWN STRL REUS W/TWL XL LVL3 (GOWN DISPOSABLE) ×2
KIT BIO-TENODESIS 3X8 DISP (MISCELLANEOUS)
KIT INSRT BABSR STRL DISP BTN (MISCELLANEOUS) IMPLANT
NDL SAFETY ECLIPSE 18X1.5 (NEEDLE) IMPLANT
NEEDLE HYPO 18GX1.5 SHARP (NEEDLE)
NEEDLE HYPO 22GX1.5 SAFETY (NEEDLE) IMPLANT
NS IRRIG 1000ML POUR BTL (IV SOLUTION) ×3 IMPLANT
PACK ACHILLES SUTUREBRIDGE (Anchor) ×3 IMPLANT
PACK BASIN DAY SURGERY FS (CUSTOM PROCEDURE TRAY) ×3 IMPLANT
PAD CAST 4YDX4 CTTN HI CHSV (CAST SUPPLIES) ×2 IMPLANT
PADDING CAST ABS 4INX4YD NS (CAST SUPPLIES) ×1
PADDING CAST ABS COTTON 4X4 ST (CAST SUPPLIES) ×2 IMPLANT
PADDING CAST COTTON 4X4 STRL (CAST SUPPLIES) ×1
PADDING CAST COTTON 6X4 STRL (CAST SUPPLIES) ×3 IMPLANT
PENCIL BUTTON HOLSTER BLD 10FT (ELECTRODE) ×3 IMPLANT
SANITIZER HAND PURELL 535ML FO (MISCELLANEOUS) ×3 IMPLANT
SHEET MEDIUM DRAPE 40X70 STRL (DRAPES) ×3 IMPLANT
SLEEVE SCD COMPRESS KNEE MED (MISCELLANEOUS) ×3 IMPLANT
SPLINT FAST PLASTER 5X30 (CAST SUPPLIES) ×20
SPLINT PLASTER CAST FAST 5X30 (CAST SUPPLIES) ×40 IMPLANT
SPONGE LAP 18X18 X RAY DECT (DISPOSABLE) ×3 IMPLANT
STAPLER VISISTAT 35W (STAPLE) IMPLANT
STOCKINETTE 6  STRL (DRAPES) ×1
STOCKINETTE 6 STRL (DRAPES) ×2 IMPLANT
SUCTION FRAZIER TIP 10 FR DISP (SUCTIONS) ×3 IMPLANT
SUT ETHIBOND 2 OS 4 DA (SUTURE) IMPLANT
SUT ETHIBOND 3-0 V-5 (SUTURE) IMPLANT
SUT ETHILON 3 0 PS 1 (SUTURE) ×3 IMPLANT
SUT FIBERWIRE #2 38 T-5 BLUE (SUTURE)
SUT MNCRL AB 3-0 PS2 18 (SUTURE) ×3 IMPLANT
SUT MNCRL AB 4-0 PS2 18 (SUTURE) IMPLANT
SUT VIC AB 0 CT1 27 (SUTURE)
SUT VIC AB 0 CT1 27XBRD ANBCTR (SUTURE) IMPLANT
SUT VIC AB 0 SH 27 (SUTURE) IMPLANT
SUT VIC AB 2-0 SH 27 (SUTURE) ×1
SUT VIC AB 2-0 SH 27XBRD (SUTURE) ×2 IMPLANT
SUTURE FIBERWR #2 38 T-5 BLUE (SUTURE) IMPLANT
SYR BULB 3OZ (MISCELLANEOUS) ×3 IMPLANT
TOWEL OR 17X24 6PK STRL BLUE (TOWEL DISPOSABLE) ×3 IMPLANT
TUBE CONNECTING 20X1/4 (TUBING) IMPLANT
UNDERPAD 30X30 (UNDERPADS AND DIAPERS) ×3 IMPLANT

## 2015-08-17 NOTE — Anesthesia Procedure Notes (Addendum)
Procedure Name: Intubation Date/Time: 08/17/2015 9:32 AM Performed by: BLOCKER, TIMOTHY D Pre-anesthesia Checklist: Patient identified, Emergency Drugs available, Suction available and Patient being monitored Patient Re-evaluated:Patient Re-evaluated prior to inductionOxygen Delivery Method: Circle System Utilized Preoxygenation: Pre-oxygenation with 100% oxygen Intubation Type: IV induction Ventilation: Mask ventilation without difficulty Laryngoscope Size: Mac and 3 Grade View: Grade II Tube type: Oral Tube size: 7.0 mm Number of attempts: 1 Airway Equipment and Method: Stylet and Oral airway Placement Confirmation: ETT inserted through vocal cords under direct vision,  positive ETCO2 and breath sounds checked- equal and bilateral Secured at: 21 cm Tube secured with: Tape Dental Injury: Teeth and Oropharynx as per pre-operative assessment    Anesthesia Regional Block:  Popliteal block  Pre-Anesthetic Checklist: ,, timeout performed, Correct Patient, Correct Site, Correct Laterality, Correct Procedure, Correct Position, site marked, Risks and benefits discussed,  Surgical consent,  Pre-op evaluation,  At surgeon's request and post-op pain management  Laterality: Right  Prep: chloraprep       Needles:   Needle Type: Stimulator Needle - 80          Additional Needles:  Procedures: Doppler guided, ultrasound guided (picture in chart) and nerve stimulator Popliteal block  Nerve Stimulator or Paresthesia:  Response: 0.5 mA,   Additional Responses:   Narrative:  Injection made incrementally with aspirations every 5 mL.  Performed by: Personally  Anesthesiologist: Judie Petit

## 2015-08-17 NOTE — Anesthesia Preprocedure Evaluation (Signed)
Anesthesia Evaluation  Patient identified by MRN, date of birth, ID band Patient awake    Reviewed: Allergy & Precautions, NPO status , Patient's Chart, lab work & pertinent test results  Airway Mallampati: II  TM Distance: >3 FB Neck ROM: Full    Dental   Pulmonary former smoker,    breath sounds clear to auscultation       Cardiovascular + CAD   Rhythm:Regular Rate:Normal     Neuro/Psych    GI/Hepatic negative GI ROS, Neg liver ROS,   Endo/Other  Hypothyroidism   Renal/GU negative Renal ROS     Musculoskeletal   Abdominal   Peds  Hematology   Anesthesia Other Findings   Reproductive/Obstetrics                             Anesthesia Physical Anesthesia Plan  ASA: III  Anesthesia Plan: General   Post-op Pain Management:    Induction: Intravenous  Airway Management Planned: LMA  Additional Equipment:   Intra-op Plan:   Post-operative Plan: Extubation in OR  Informed Consent: I have reviewed the patients History and Physical, chart, labs and discussed the procedure including the risks, benefits and alternatives for the proposed anesthesia with the patient or authorized representative who has indicated his/her understanding and acceptance.   Dental advisory given  Plan Discussed with: CRNA and Anesthesiologist  Anesthesia Plan Comments:         Anesthesia Quick Evaluation

## 2015-08-17 NOTE — Discharge Instructions (Addendum)
Austin Hewitt, MD °[facility] ° °Please read the following information regarding your care after surgery. ° °Medications  °You only need a prescription for the narcotic pain medicine (ex. oxycodone, Percocet, Norco).  All of the other medicines listed below are available over the counter. °X acetominophen (Tylenol) 650 mg every 4-6 hours as you need for minor pain °X oxycodone as prescribed for moderate to severe pain °?  ° °Narcotic pain medicine (ex. oxycodone, Percocet, Vicodin) will cause constipation.  To prevent this problem, take the following medicines while you are taking any pain medicine. °X docusate sodium (Colace) 100 mg twice a day X senna (Senokot) 2 tablets twice a day ° °X To help prevent blood clots, take an aspirin (325 mg) once a day for a month after surgery.  You should also get up every hour while you are awake to move around.   ° °Weight Bearing °X Do not bear any weight on the operated leg or foot. ° °Cast / Splint / Dressing °X Keep your splint or cast clean and dry.  Don’t put anything (coat hanger, pencil, etc) down inside of it.  If it gets damp, use a hair dryer on the cool setting to dry it.  If it gets soaked, call the office to schedule an appointment for a cast change. ° ° °After your dressing, cast or splint is removed; you may shower, but do not soak or scrub the wound.  Allow the water to run over it, and then gently pat it dry. ° °Swelling °It is normal for you to have swelling where you had surgery.  To reduce swelling and pain, keep your toes above your nose for at least 3 days after surgery.  It may be necessary to keep your foot or leg elevated for several weeks.  If it hurts, it should be elevated. ° °Follow Up °Call my office at [phone] when you are discharged from the hospital or surgery center to schedule an appointment to be seen two weeks after surgery. ° °Call my office at [phone] if you develop a fever >101.5° F, nausea, vomiting, bleeding from  the surgical site or severe pain.   ° °Regional Anesthesia Blocks ° °1. Numbness or the inability to move the "blocked" extremity may last from 3-48 hours after placement. The length of time depends on the medication injected and your individual response to the medication. If the numbness is not going away after 48 hours, call your surgeon. ° °2. The extremity that is blocked will need to be protected until the numbness is gone and the  Strength has returned. Because you cannot feel it, you will need to take extra care to avoid injury. Because it may be weak, you may have difficulty moving it or using it. You may not know what position it is in without looking at it while the block is in effect. ° °3. For blocks in the legs and feet, returning to weight bearing and walking needs to be done carefully. You will need to wait until the numbness is entirely gone and the strength has returned. You should be able to move your leg and foot normally before you try and bear weight or walk. You will need someone to be with you when you first try to ensure you do not fall and possibly risk injury. ° °4. Bruising and tenderness at the needle site are common side effects and will resolve in a few days. ° °5. Persistent numbness or new problems with movement   should be communicated to the surgeon or the [facility] ([phone])/ [facility] ([phone]). ° °Post Anesthesia Home Care Instructions ° °Activity: °Get plenty of rest for the remainder of the day. A responsible adult should stay with you for 24 hours following the procedure.  °For the next 24 hours, DO NOT: °-Drive a car °-Operate machinery °-Drink alcoholic beverages °-Take any medication unless instructed by your physician °-Make any legal decisions or sign important papers. ° °Meals: °Start with liquid foods such as gelatin or soup. Progress to regular foods as tolerated. Avoid greasy, spicy, heavy foods. If nausea and/or vomiting occur,  drink only clear liquids until the nausea and/or vomiting subsides. Call your physician if vomiting continues. ° °Special Instructions/Symptoms: °Your throat may feel dry or sore from the anesthesia or the breathing tube placed in your throat during surgery. If this causes discomfort, gargle with warm salt water. The discomfort should disappear within 24 hours. ° °If you had a scopolamine patch placed behind your ear for the management of post- operative nausea and/or vomiting: ° °1. The medication in the patch is effective for 72 hours, after which it should be removed.  Wrap patch in a tissue and discard in the trash. Wash hands thoroughly with soap and water. °2. You may remove the patch earlier than 72 hours if you experience unpleasant side effects which may include dry mouth, dizziness or visual disturbances. °3. Avoid touching the patch. Wash your hands with soap and water after contact with the patch. °  ° °

## 2015-08-17 NOTE — Op Note (Signed)
Austin Odom NO.:  [id number]  MEDICAL RECORD NO.:  [id number]  LOCATION:                                 FACILITY:  PHYSICIAN:  Austin Arthurs, MD             DATE OF BIRTH:  DATE OF PROCEDURE:  08/17/2015 DATE OF DISCHARGE:                              OPERATIVE REPORT   PREOPERATIVE DIAGNOSES: 1. Right Achilles insertional tendinopathy. 2. Tight right heel cord. 3. Right calcaneus Haglund deformity.  POSTOPERATIVE DIAGNOSES: 1. Right Achilles insertional tendinopathy. 2. Tight right heel cord. 3. Right calcaneus Haglund deformity.  PROCEDURE: 1. Right gastrocnemius recession through a separate incision. 2. Excision of right calcaneus Haglund deformity. 3. Right Achilles tendon debridement and reconstruction.  SURGEON:  Austin Odom, M.D.  ASSISTANT:  Austin Martinez, PA-C.  ANESTHESIA:  General, regional.  ESTIMATED BLOOD LOSS:  Minimal.  TOURNIQUET TIME:  57 minutes at 250 mmHg.  COMPLICATIONS:  None apparent.  DISPOSITION:  Extubated, awake, and stable to recovery.  INDICATIONS FOR PROCEDURE:  The patient is a 69 year old male with a long history of right heel pain.  He has failed nonoperative treatment to date.  MRI and plain radiographs show insertional Achilles tendinopathy with a prominent Haglund deformity.  He also has a tight right heel cord.  He presents now for operative treatment of this painful condition.  He understands the risks and benefits, the alternative treatment options, and elects surgical treatment.  He specifically understands risks of bleeding, infection, nerve damage, blood clots, need for additional surgery, continued pain, amputation, and death.  PROCEDURE IN DETAIL:  After preoperative consent was obtained and the correct operative site was identified, the patient was brought to the operating room supine on the stretcher.  General anesthesia was induced. Preoperative antibiotics were administered.  Surgical  time-out was taken.  The right lower extremity was exsanguinated and a thigh tourniquet inflated to 250 mmHg.  The patient was then turned into the prone position on the operating table with all bony prominences padded well.  The right lower extremity was then prepped and draped in standard sterile fashion.  A longitudinal incision was then made at the gastrocnemius tendon.  Sharp dissection was carried down through skin and subcutaneous tissue.  Care was taken to protect the sural nerve and lesser saphenous vein.  The gastrocnemius tendon was identified.  It was divided in its entirety.  Wound was irrigated and closed with Monocryl and nylon.  Attention was then turned to the posterior aspect of the heel where longitudinal incision was made.  Sharp dissection was carried down through the skin and subcutaneous tissue and paratenon.  The paratenon was then elevated exposing the Achilles tendon.  The tendon was split longitudinally and elevated from its insertion at the calcaneus. Calcifications within the tendon were excised sharply.  The tendon was then debrided of all diseased and nonviable tissue.  This was done sharply with a #15 blade.  The insertional enthesophyte and Haglund deformity were then excised in their entirety with an oscillating saw. The cut surface of bone was smoothed with a rasp.  The wound was irrigated copiously.  The Achilles tendon  was then repaired back to the insertion on the calcaneus using the Arthrex suture bridge device.  This created an hourglass pattern of suture repairing the tendon to bone with 4 suture anchors.  Wound was then irrigated copiously.  The paratenon was repaired with inverted simple sutures of 2-0 Vicryl and 3-0 Monocryl.  Skin was closed with a running 3-0 nylon suture.  Sterile dressings were applied, followed by a well-padded short-leg splint. Tourniquet was released after application of the dressings at 57 minutes.  The patient was  awakened from anesthesia and transported to the recovery room in stable condition.  FOLLOWUP PLAN:  The patient will be nonweightbearing on the right lower extremity.  He will follow up with me in 2 weeks for suture removal and conversion to a CAM walker boot with a heel lift.  He will start aspirin for DVT prophylaxis.  Austin Martinez, PA-C was present and scrubbed for the duration of the case.  His assistance was essential in positioning the patient, prepping and draping, gaining and maintaining exposure, performing the operation, closing and dressing the wounds, and applying the splint.     Austin Arthurs, MD     JH/MEDQ  D:  08/17/2015  T:  08/17/2015  Job:  [id number]

## 2015-08-17 NOTE — Brief Op Note (Signed)
08/17/2015  10:56 AM  PATIENT:  Austin Odom  69 y.o. male  PRE-OPERATIVE DIAGNOSIS: 1.  Right achilles tendonitis      2.  Tight right heelcord      3.  Right calcaneus Haglund deformity  POST-OPERATIVE DIAGNOSIS:  same  Procedure(s): 1.  RIGHT ACHILLES DEBRIDEMENT AND RECONSTRUCTION 2.  EXCISION OF RIGHT HAGLUND'S DEFORMITY 3.  RIGHT GASTROC RECESSION separate incision  SURGEON:  Toni Arthurs, MD  ASSISTANT: Alfredo Martinez, PA-C  ANESTHESIA:   General, regional  EBL:  minimal   TOURNIQUET:   Total Tourniquet Time Documented: Thigh (Right) - 57 minutes Total: Thigh (Right) - 57 minutes   COMPLICATIONS:  None apparent  DISPOSITION:  Extubated, awake and stable to recovery.  DICTATION ID:  [id number]

## 2015-08-17 NOTE — Anesthesia Postprocedure Evaluation (Signed)
  Anesthesia Post-op Note  Patient: Austin Odom  Procedure(s) Performed: Procedure(s): RIGHT ACHILLES DEBRIDEMENT AND RECONSTRUCTION,  EXCISION OF HAGLUND'S DEFORMITY (Right) RIGHT GASTROC RECESSION (Right)  Patient Location: PACU  Anesthesia Type:General  Level of Consciousness: awake  Airway and Oxygen Therapy: Patient Spontanous Breathing  Post-op Pain: mild  Post-op Assessment: Post-op Vital signs reviewed     RLE Motor Response: Purposeful movement RLE Sensation: Full sensation      Post-op Vital Signs: Reviewed  Last Vitals:  Filed Vitals:   08/17/15 1150  BP: 162/66  Pulse: 65  Temp: 36.4 C  Resp: 20    Complications: No apparent anesthesia complications

## 2015-08-17 NOTE — Transfer of Care (Signed)
Immediate Anesthesia Transfer of Care Note  Patient: Austin Odom  Procedure(s) Performed: Procedure(s): RIGHT ACHILLES DEBRIDEMENT AND RECONSTRUCTION,  EXCISION OF HAGLUND'S DEFORMITY (Right) RIGHT GASTROC RECESSION (Right)  Patient Location: PACU  Anesthesia Type:GA combined with regional for post-op pain  Level of Consciousness: awake, alert , oriented and patient cooperative  Airway & Oxygen Therapy: Patient Spontanous Breathing and Patient connected to face mask oxygen  Post-op Assessment: Report given to RN and Post -op Vital signs reviewed and stable  Post vital signs: Reviewed and stable  Last Vitals:  Filed Vitals:   08/17/15 1047  BP: 167/68  Pulse:   Temp:   Resp:     Complications: No apparent anesthesia complications

## 2015-08-17 NOTE — Progress Notes (Signed)
Assisted Dr. Edwards with right, ultrasound guided, popliteal block. Side rails up, monitors on throughout procedure. See vital signs in flow sheet. Tolerated Procedure well. 

## 2015-08-17 NOTE — H&P (Signed)
Austin Odom is an 69 y.o. male.   Chief Complaint:  Right heel pain HPI: 69 y/o male with PMH of CAD c/o R heel pain for several years.  He has failed treatment so far and presents now for surgery.  Past Medical History  Diagnosis Date  . Coronary artery disease   . Hypothyroidism     Past Surgical History  Procedure Laterality Date  . Stents    . Tonsillectomy      Family History  Problem Relation Age of Onset  . Stroke Mother   . Hypothyroidism Mother   . Heart disease Father    Social History:  reports that he has quit smoking. He has never used smokeless tobacco. He reports that he drinks alcohol. He reports that he does not use illicit drugs.  Allergies: No Known Allergies  Medications Prior to Admission  Medication Sig Dispense Refill  . ascorbic acid (VITAMIN C) 1000 MG tablet Take 1,000 mg by mouth daily.    Marland Kitchen aspirin 81 MG tablet Take 81 mg by mouth daily.    Marland Kitchen atorvastatin (LIPITOR) 80 MG tablet Take 80 mg by mouth daily.    . cetirizine (ZYRTEC) 10 MG tablet Take 1 tablet by mouth as needed for allergies (take one tablet by mouth daily as needed for allergies).   1  . Coenzyme Q10 (CO Q10) 100 MG CAPS Take 1 capsule by mouth daily.     . fish oil-omega-3 fatty acids 1000 MG capsule Take 2 g by mouth daily.    . fluticasone (FLONASE) 50 MCG/ACT nasal spray Place 2 sprays into both nostrils as needed for allergies (take 2 sprays each nostril twice daily as needed for seasonal allergies).     Marland Kitchen GLUCOSAMINE SULFATE PO Take 1 capsule by mouth daily. Taking 1000 daily    . levothyroxine (SYNTHROID, LEVOTHROID) 100 MCG tablet Take 100 mcg by mouth daily before breakfast.     . meloxicam (MOBIC) 7.5 MG tablet Take 1 tablet by mouth as needed. Take one tablet by mouth as needed for pain  0    No results found for this or any previous visit (from the past 48 hour(s)). No results found.  ROS  No recent f/c/n/v/wt loss  Blood pressure 155/67, pulse 54, temperature 98  F (36.7 C), temperature source Oral, resp. rate 13, height  (1.803 m), weight 83.462 kg (184 lb), SpO2 98 %. Physical Exam  wn wd male in nad.  A and O x 4.  Mood and affect normal.  EOMI.  resp unlabored.  R heel TTP at insertion of AT.  No lymphadenopathy.  5/5 strength in PF .  Heelcord is tight.  Pulses palpable in foot.  Assessment/Plan R achilles tendonitis, Haglund deformity and tight heelcord.  To OR for gastroc recession, excision of Haglund deformity and achilles tendon debridement and reconstruction.  The risks and benefits of the alternative treatment options have been discussed in detail.  The patient wishes to proceed with surgery and specifically understands risks of bleeding, infection, nerve damage, blood clots, need for additional surgery, amputation and death.   Toni Arthurs 2015/08/25, 9:10 AM

## 2015-08-18 ENCOUNTER — Encounter (HOSPITAL_BASED_OUTPATIENT_CLINIC_OR_DEPARTMENT_OTHER): Payer: Self-pay | Admitting: Orthopedic Surgery

## 2016-02-23 ENCOUNTER — Ambulatory Visit (INDEPENDENT_AMBULATORY_CARE_PROVIDER_SITE_OTHER): Payer: Medicare Other | Admitting: Cardiovascular Disease

## 2016-02-23 ENCOUNTER — Encounter: Payer: Self-pay | Admitting: Cardiovascular Disease

## 2016-02-23 VITALS — BP 146/64 | HR 56 | Ht 71.0 in | Wt 187.8 lb

## 2016-02-23 DIAGNOSIS — I2510 Atherosclerotic heart disease of native coronary artery without angina pectoris: Secondary | ICD-10-CM

## 2016-02-23 NOTE — Progress Notes (Signed)
Cardiology Office Note Date:  02/24/2016   ID:  Austin Odom, DOB 1946/11/12, MRN [id number]  PCP:  Johny Blamer, MD  Cardiologist:  Tonny Bollman, MD    Chief Complaint  Patient presents with  . Coronary Artery Disease    denies cp, sob, le edema, or claudication     History of Present Illness: Austin Odom is a 70 y.o. male who presents for follow-up of CAD.   He underwent remote PCI in the 1990s. He has known chronic occlusion of his LAD and has undergone PTCA of his diagonal branches. He's had preserved left ventricular function. He's been managed medically for many years. The patient had a normal stress Myoview in 2014. An event monitor in 2015 was performed to evaluate an episode of syncope. This revealed no arrhythmia.  Home BP's are in the range of 135/65-75. He feels good. He had Achilles surgery several months ago and has had a good recovery. He had no perioperative problems. He's physically active and plays tennis regularly. He denies chest pain, chest pressure, edema, orthopnea, or PND.   Past Medical History  Diagnosis Date  . Coronary artery disease   . Hypothyroidism     Past Surgical History  Procedure Laterality Date  . Stents    . Tonsillectomy    . Excision haglund's deformity with achilles tendon repair Right 08/17/2015    Procedure: RIGHT ACHILLES DEBRIDEMENT AND RECONSTRUCTION,  EXCISION OF HAGLUND'S DEFORMITY;  Surgeon: Toni Arthurs, MD;  Location: [facility];  Service: Orthopedics;  Laterality: Right;  . Gastroc recession extremity Right 08/17/2015    Procedure: RIGHT GASTROC RECESSION;  Surgeon: Toni Arthurs, MD;  Location: [facility];  Service: Orthopedics;  Laterality: Right;    Current Outpatient Prescriptions  Medication Sig Dispense Refill  . ascorbic acid (VITAMIN C) 1000 MG tablet Take 1,000 mg by mouth daily.    Marland Kitchen aspirin EC 325 MG tablet Take 1 tablet (325 mg total) by mouth daily. 42 tablet 0  .  atorvastatin (LIPITOR) 80 MG tablet Take 80 mg by mouth daily.    . cetirizine (ZYRTEC) 10 MG tablet Take 1 tablet by mouth daily as needed for allergies.   1  . Coenzyme Q10 (CO Q10) 100 MG CAPS Take 1 capsule by mouth daily.     Marland Kitchen docusate sodium (COLACE) 100 MG capsule Take 1 capsule (100 mg total) by mouth 2 (two) times daily. While taking narcotic pain medicine. 30 capsule 0  . fish oil-omega-3 fatty acids 1000 MG capsule Take 2 g by mouth daily.    . fluticasone (FLONASE) 50 MCG/ACT nasal spray Place 2 sprays into both nostrils 2 (two) times daily as needed for allergies.     Marland Kitchen GLUCOSAMINE SULFATE PO Take 1 capsule by mouth daily. Taking 1000 daily    . levothyroxine (SYNTHROID, LEVOTHROID) 100 MCG tablet Take 100 mcg by mouth daily before breakfast.     . meloxicam (MOBIC) 7.5 MG tablet Take 1 tablet by mouth daily as needed for pain.   0  . nitroGLYCERIN (NITROSTAT) 0.4 MG SL tablet Place 0.4 mg under the tongue as directed. Place 1 tablet under the tongue every 5 minutes as needed for chest pain (do not exceed 3 doses)  1  . oxyCODONE (ROXICODONE) 5 MG immediate release tablet Take 1-2 tablets (5-10 mg total) by mouth every 4 (four) hours as needed for moderate pain or severe pain. 30 tablet 0  . senna (SENOKOT) 8.6 MG TABS  tablet Take 2 tablets (17.2 mg total) by mouth 2 (two) times daily. 30 each 0   No current facility-administered medications for this visit.    Allergies:   Review of patient's allergies indicates no known allergies.   Social History:  The patient  reports that he has quit smoking. He has never used smokeless tobacco. He reports that he drinks alcohol. He reports that he does not use illicit drugs.   Family History:  The patient's  family history includes Heart disease in his father; Hypothyroidism in his mother; Stroke in his mother.    ROS:  Please see the history of present illness.  All other systems are reviewed and negative.    PHYSICAL EXAM: VS:  BP  146/64 mmHg  Pulse 56  Ht  (1.803 m)  Wt 187 lb 12.8 oz (85.186 kg)  BMI 26.20 kg/m2 , BMI Body mass index is 26.2 kg/(m^2). GEN: Well nourished, well developed, in no acute distress HEENT: normal Neck: no JVD, no masses. No carotid bruits Cardiac: RRR without murmur or gallop                Respiratory:  clear to auscultation bilaterally, normal work of breathing GI: soft, nontender, nondistended, + BS MS: no deformity or atrophy Ext: no pretibial edema, pedal pulses 2+= bilaterally Skin: warm and dry, no rash Neuro:  Strength and sensation are intact Psych: euthymic mood, full affect  EKG:  EKG is ordered today. The ekg ordered today shows sinus bradycardia 56 bpm, otherwise within normal limits  Recent Labs: No results found for requested labs within last 365 days.   Lipid Panel  No results found for: CHOL, TRIG, HDL, CHOLHDL, VLDL, LDLCALC, LDLDIRECT    Wt Readings from Last 3 Encounters:  02/23/16 187 lb 12.8 oz (85.186 kg)  08/17/15 184 lb (83.462 kg)  01/24/15 187 lb 6.4 oz (85.004 kg)     Cardiac Studies Reviewed: Myoview May 05, 2013: QPS Raw Data Images: Normal; no motion artifact; normal heart/lung ratio. Stress Images: Normal homogeneous uptake in all areas of the myocardium. Rest Images: Normal homogeneous uptake in all areas of the myocardium. Subtraction (SDS): There is no evidence of scar or ischemia. Transient Ischemic Dilatation (Normal <1.22): 1.06 Lung/Heart Ratio (Normal <0.45): 0.34  Quantitative Gated Spect Images QGS EDV: 93 ml QGS ESV: 39 ml  Impression Exercise Capacity: Good exercise capacity. BP Response: Hypertensive blood pressure response. Clinical Symptoms: Fatigue, no chest pain.  ECG Impression: 1 mm horizontal ST depression in leads V4 and V5 at peak exercise resolved quickly in recovery.  Comparison with Prior Nuclear Study: No images to compare  Overall Impression: Normal stress nuclear study. Equivocal  stress ECG response probably nonspecific given rapid resolution of ST segments in recovery.   LV Ejection Fraction: 58%. LV Wall Motion: NL LV Function; NL Wall Motion  ASSESSMENT AND PLAN: 1.  CAD, native vessel, without angina: The patient continues to be stable on aspirin and atorvastatin. I will see him back in one year. He is completely asymptomatic at a good workload.  2. [place] hypertension: Blood pressures are reviewed. Home readings are good. I advised him to continue with these. Systolic BP is mildly increased today.  3. Hyperlipidemia: Treated with high intensity statin (atorvastatin 80 mg). Followed by primary care.  In summary the patient is doing very well. His lab work is checked twice per year by his primary physician. I will see him back in one year.  Current medicines are reviewed  with the patient today.  The patient  concerns regarding medicines.  Labs/ tests ordered today include:   Orders Placed This Encounter  Procedures  . EKG 12-Lead    Disposition:   FU one year  Signed, Tonny Bollmanooper, Nasario Czerniak, MD  02/24/2016 6:00 AM    [facility] [address], [place], [place]  [address] Phone: [phone]; Fax: [phone]

## 2016-02-23 NOTE — Patient Instructions (Signed)

## 2017-08-20 ENCOUNTER — Encounter (INDEPENDENT_AMBULATORY_CARE_PROVIDER_SITE_OTHER): Payer: Self-pay

## 2017-08-20 ENCOUNTER — Encounter: Payer: Self-pay | Admitting: Cardiovascular Disease

## 2017-08-20 ENCOUNTER — Ambulatory Visit (INDEPENDENT_AMBULATORY_CARE_PROVIDER_SITE_OTHER): Payer: Medicare Other | Admitting: Cardiovascular Disease

## 2017-08-20 VITALS — BP 142/70 | HR 64 | Ht 70.5 in | Wt 188.8 lb

## 2017-08-20 DIAGNOSIS — I2510 Atherosclerotic heart disease of native coronary artery without angina pectoris: Secondary | ICD-10-CM | POA: Diagnosis not present

## 2017-08-20 DIAGNOSIS — I10 Essential (primary) hypertension: Secondary | ICD-10-CM | POA: Diagnosis not present

## 2017-08-20 MED ORDER — ASPIRIN EC 81 MG PO TBEC: 81.0000 mg | DELAYED_RELEASE_TABLET | Freq: Every day | ORAL | 3 refills | Status: AC

## 2017-08-20 NOTE — Progress Notes (Signed)
Cardiology Office Note Date:  08/20/2017   ID:  Nazier Neyhart, DOB [birth year], MRN [id number]  PCP:  Johny Blamer, MD  Cardiologist:  Tonny Bollman, MD    Chief Complaint  Patient presents with  . Follow-up     History of Present Illness: Austin Odom is a 71 y.o. male who presents for follow-up of CAD.   He underwent remote PCI in the 1990s. He has known chronic occlusion of his LAD and has undergone PTCA of his diagonal branches. He's had preserved left ventricular function. He's been managed medically for many years. The patient had a normal stress Myoview in 2014. An event monitor in 2015 was performed to evaluate an episode of syncope. This revealed no arrhythmia.  He feels well. Today, he denies symptoms of palpitations, chest pain, shortness of breath, orthopnea, PND, lower extremity edema, dizziness, or syncope. He plays tennis and walks for exercise regularly. He still plays singles tennis and beat a 72 yo yesterday.   Past Medical History:  Diagnosis Date  . Coronary artery disease   . Hypothyroidism     Past Surgical History:  Procedure Laterality Date  . 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 GASTROC RECESSION EXTREMITY Right 08/17/2015   Procedure: RIGHT GASTROC RECESSION;  Surgeon: Toni Arthurs, MD;  Location: [facility];  Service: Orthopedics;  Laterality: Right;  . stents    . TONSILLECTOMY      Current Outpatient Prescriptions  Medication Sig Dispense Refill  . ascorbic acid (VITAMIN C) 1000 MG tablet Take 1,000 mg by mouth daily.    Marland Kitchen atorvastatin (LIPITOR) 80 MG tablet Take 80 mg by mouth daily.    . cetirizine (ZYRTEC) 10 MG tablet Take 1 tablet by mouth daily as needed for allergies.   1  .  Coenzyme Q10 (CO Q10) 100 MG CAPS Take 1 capsule by mouth daily.     Marland Kitchen docusate sodium (COLACE) 100 MG capsule Take 1 capsule (100 mg total) by mouth 2 (two) times daily. While taking narcotic pain medicine. 30 capsule 0  . fish oil-omega-3 fatty acids 1000 MG capsule Take 2 g by mouth daily.    . fluticasone (FLONASE) 50 MCG/ACT nasal spray Place 2 sprays into both nostrils 2 (two) times daily as needed for allergies.     Marland Kitchen GLUCOSAMINE SULFATE PO Take 1 capsule by mouth daily. Taking 1000 daily    . levothyroxine (SYNTHROID, LEVOTHROID) 100 MCG tablet Take 100 mcg by mouth daily before breakfast.     . meloxicam (MOBIC) 7.5 MG tablet Take 1 tablet by mouth daily as needed for pain.   0  . nitroGLYCERIN (NITROSTAT) 0.4 MG SL tablet Place 0.4 mg under the tongue as directed. Place 1 tablet under the tongue every 5 minutes as needed for chest pain (do not exceed 3 doses)  1  . oxyCODONE (ROXICODONE) 5 MG immediate release tablet Take 1-2 tablets (5-10 mg total) by mouth every 4 (four) hours as needed for moderate pain or severe pain. 30 tablet 0  . senna (SENOKOT) 8.6 MG TABS tablet Take 2 tablets (17.2 mg total) by mouth 2 (two) times daily. 30 each 0  . aspirin EC 81 MG tablet Take 1 tablet (81 mg total) by mouth daily. 90 tablet 3   No  current facility-administered medications for this visit.     Allergies:   Patient has no known allergies.   Social History:  The patient  reports that he has quit smoking. He has never used smokeless tobacco. He reports that he drinks alcohol. He reports that he does not use drugs.   Family History:  The patient's  family history includes Heart disease in his father; Hypothyroidism in his mother; Stroke in his mother.    ROS:  Please see the history of present illness.   All other systems are reviewed and negative.    PHYSICAL EXAM: VS:  BP (!) 142/70   Pulse 64   Ht 5' 10.5" (1.791 m)   Wt 85.6 kg (188 lb 12.8 oz)   BMI 26.71 kg/m  , BMI Body mass  index is 26.71 kg/m. GEN: Well nourished, well developed, in no acute distress  HEENT: normal  Neck: no JVD, no masses. No carotid bruits Cardiac: RRR without murmur or gallop                Respiratory:  clear to auscultation bilaterally, normal work of breathing GI: soft, nontender, nondistended, + BS MS: no deformity or atrophy  Ext: no pretibial edema, pedal pulses 2+= bilaterally Skin: warm and dry, no rash Neuro:  Strength and sensation are intact Psych: euthymic mood, full affect  EKG:  EKG is ordered today. The ekg ordered today shows NSR 64 bpm, within normal limits  Recent Labs: No results found for requested labs within last 8760 hours.   Lipid Panel  No results found for: CHOL, TRIG, HDL, CHOLHDL, VLDL, LDLCALC, LDLDIRECT    Wt Readings from Last 3 Encounters:  08/20/17 85.6 kg (188 lb 12.8 oz)  02/23/16 85.2 kg (187 lb 12.8 oz)  08/17/15 83.5 kg (184 lb)     ASSESSMENT AND PLAN: 1.  CAD, native vessel, without angina. Continue same Rx, except reduce ASA dose to 81 mg daily.  2. Hyperlipidemia: tolerating atorvastatin 80 mg daily. Lipids followed by Dr Tiburcio Pea.   3. HTN, suboptimal control. He has a cold today. BP borderline. I asked him to monitor BP for 3-4 weeks and send BP log through MyChart. Will review and make further recommendations.   Current medicines are reviewed with the patient today.  The patient does not have concerns regarding medicines.  Labs/ tests ordered today include:   Orders Placed This Encounter  Procedures  . EKG 12-Lead    Disposition:   FU one year  Signed, Tonny Bollman, MD  08/20/2017 1:42 PM    [facility] [address] [place], [place], [place]  [address] Phone: [phone]; Fax: [phone]

## 2017-08-20 NOTE — Patient Instructions (Addendum)
Medication Instructions:  1) DECREASE ASPIRIN to 81 mg daily  Labwork: None  Testing/Procedures: None  Follow-Up: Your provider wants you to follow-up in: 1 year with Dr.Cooper. You will receive a reminder letter in the mail two months in advance. If you don't receive a letter, please call our office to schedule the follow-up appointment.    Any Other Special Instructions Will Be Listed Below (If Applicable). Please check your blood pressure a couple times a week for about a month and contact our office with results.    If you need a refill on your cardiac medications before your next appointment, please call your pharmacy.

## 2018-12-07 ENCOUNTER — Other Ambulatory Visit: Payer: Self-pay | Admitting: Chiropractic Medicine

## 2018-12-07 DIAGNOSIS — M5416 Radiculopathy, lumbar region: Secondary | ICD-10-CM

## 2018-12-08 ENCOUNTER — Ambulatory Visit
Admission: RE | Admit: 2018-12-08 | Discharge: 2018-12-08 | Disposition: A | Discharge status: Home / Self Care | Payer: Medicare Other | Source: Ambulatory Visit | Attending: Chiropractic Medicine | Admitting: Chiropractic Medicine

## 2018-12-08 DIAGNOSIS — M5416 Radiculopathy, lumbar region: Secondary | ICD-10-CM

## 2018-12-15 ENCOUNTER — Encounter: Payer: Self-pay | Admitting: Cardiovascular Disease

## 2018-12-16 ENCOUNTER — Encounter: Payer: Self-pay | Admitting: Cardiovascular Disease

## 2018-12-16 ENCOUNTER — Ambulatory Visit (INDEPENDENT_AMBULATORY_CARE_PROVIDER_SITE_OTHER): Payer: Medicare Other | Admitting: Cardiovascular Disease

## 2018-12-16 VITALS — BP 154/78 | HR 58 | Ht 70.5 in | Wt 191.2 lb

## 2018-12-16 DIAGNOSIS — I700 Atherosclerosis of aorta: Secondary | ICD-10-CM

## 2018-12-16 DIAGNOSIS — E782 Mixed hyperlipidemia: Secondary | ICD-10-CM | POA: Diagnosis not present

## 2018-12-16 DIAGNOSIS — I10 Essential (primary) hypertension: Secondary | ICD-10-CM | POA: Diagnosis not present

## 2018-12-16 DIAGNOSIS — I2510 Atherosclerotic heart disease of native coronary artery without angina pectoris: Secondary | ICD-10-CM

## 2018-12-16 MED ORDER — AMLODIPINE BESYLATE 5 MG PO TABS: 5.0000 mg | ORAL_TABLET | Freq: Every day | ORAL | 3 refills | Status: DC

## 2018-12-16 NOTE — Patient Instructions (Signed)
Medication Instructions:  1) START AMLODIPINE (Norvasc) 5 mg daily  Labwork: None  Testing/Procedures: Your physician has requested that you have an abdominal aorta duplex. During this test, an ultrasound is used to evaluate the aorta. Allow 30 minutes for this exam. Do not eat after midnight the day before and avoid carbonated beverages  Please call when you are ready to schedule your nuclear stress test (when your back is better).  Follow-Up: Your provider wants you to follow-up in: 1 year with Dr. Excell Seltzer. You will receive a reminder letter in the mail two months in advance. If you don't receive a letter, please call our office to schedule the follow-up appointment.

## 2018-12-16 NOTE — Progress Notes (Signed)
Cardiology Office Note:    Date:  12/16/2018   ID:  Austin Abedavid Neal Wohlford, DOB 05/12/1946, MRN [id number]  PCP:  Johny BlamerHarris, William, MD  Cardiologist:  Tonny BollmanMichael Maclovio Henson, MD  Electrophysiologist:  None   Referring MD: Johny BlamerHarris, William, MD   Chief Complaint  Patient presents with  . Coronary Artery Disease   History of Present Illness:    Austin Odom is a 73 y.o. male with a hx of coronary artery disease, presenting for follow-up evaluation.  The patient has a history of remote PCI in the 1990s.  He has known chronic occlusion of the LAD and has undergone PTCA of the diagonal branches.  On past assessment he has had normal LV systolic function.  He has been managed medically with no recurrent ischemic events.  His last stress test in 2014 demonstrated normal perfusion.  The patient is here alone today.  He continues to do well from a cardiac perspective.  He had a back injury recently and has been limited by back pain and muscle spasm.  He denies chest pain, chest pressure, shortness of breath, or leg swelling.  He has had no heart palpitations.  Past Medical History:  Diagnosis Date  . Coronary artery disease   . Hypothyroidism     Past Surgical History:  Procedure Laterality Date  . 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 GASTROC RECESSION EXTREMITY Right 08/17/2015   Procedure: RIGHT GASTROC RECESSION;  Surgeon: Toni ArthursJohn Hewitt, MD;  Location: [facility];  Service: Orthopedics;  Laterality: Right;  . stents    . TONSILLECTOMY      Current Medications: Current Meds  Medication Sig  . ascorbic acid (VITAMIN C) 1000 MG tablet Take 1,000 mg by mouth daily.  Marland Kitchen. aspirin EC 81 MG tablet Take 1 tablet (81 mg total) by mouth daily.  Marland Kitchen.  atorvastatin (LIPITOR) 80 MG tablet Take 80 mg by mouth daily.  . cetirizine (ZYRTEC) 10 MG tablet Take 1 tablet by mouth daily as needed for allergies.   . Coenzyme Q10 (CO Q10) 100 MG CAPS Take 1 capsule by mouth daily.   . fish oil-omega-3 fatty acids 1000 MG capsule Take 2 g by mouth daily.  . fluticasone (FLONASE) 50 MCG/ACT nasal spray Place 2 sprays into both nostrils 2 (two) times daily as needed for allergies.   Marland Kitchen. GLUCOSAMINE SULFATE PO Take 1 capsule by mouth daily. Taking 1000 daily  . levothyroxine (SYNTHROID, LEVOTHROID) 100 MCG tablet Take 100 mcg by mouth daily before breakfast.   . meloxicam (MOBIC) 7.5 MG tablet Take 1 tablet by mouth daily as needed for pain.   . Menaquinone-7 (VITAMIN K2 PO) Take by mouth daily.  . Multiple Vitamins-Minerals (ZINC PO) Take by mouth daily.  . nitroGLYCERIN (NITROSTAT) 0.4 MG SL tablet Place 0.4 mg under the tongue as directed. Place 1 tablet under the tongue every 5 minutes as needed for chest pain (do not exceed 3 doses)  . VITAMIN K PO Take by mouth daily.     Allergies:   Patient has no known allergies.   Social History   Socioeconomic History  . Marital status: Married    Spouse name: Not on file  . Number of children: Not on file  . Years of education: Not on file  . Highest education  level: Not on file  Occupational History  . Not on file  Social Needs  . Financial resource strain: Not on file  . Food insecurity:    Worry: Not on file    Inability: Not on file  . Transportation needs:    Medical: Not on file    Non-medical: Not on file  Tobacco Use  . Smoking status: Former Games developer  . Smokeless tobacco: Never Used  Substance and Sexual Activity  . Alcohol use: Yes    Comment: occas  . Drug use: No  . Sexual activity: Not on file  Lifestyle  . Physical activity:    Days per week: Not on file    Minutes per session: Not on file  . Stress: Not on file  Relationships  . Social connections:    Talks on phone: Not on  file    Gets together: Not on file    Attends religious service: Not on file    Active member of club or organization: Not on file    Attends meetings of clubs or organizations: Not on file    Relationship status: Not on file  Other Topics Concern  . Not on file  Social History Narrative  . Not on file     Family History: The patient's family history includes Heart disease in his father; Hypothyroidism in his mother; Stroke in his mother.  ROS:   Please see the history of present illness.    Positive for back pain.  All other systems reviewed and are negative.  EKGs/Labs/Other Studies Reviewed:    EKG:  EKG is ordered today.  The ekg ordered today demonstrates sinus rhythm 58 bpm, nonspecific ST abnormality.  Recent Labs: No results found for requested labs within last 8760 hours.  Recent Lipid Panel No results found for: CHOL, TRIG, HDL, CHOLHDL, VLDL, LDLCALC, LDLDIRECT  Physical Exam:    VS:  BP (!) 154/78   Pulse (!) 58   Ht 5' 10.5" (1.791 m)   Wt 191 lb 3.2 oz (86.7 kg)   SpO2 95%   BMI 27.05 kg/m     Wt Readings from Last 3 Encounters:  12/16/18 191 lb 3.2 oz (86.7 kg)  08/20/17 188 lb 12.8 oz (85.6 kg)  02/23/16 187 lb 12.8 oz (85.2 kg)     GEN: Well nourished, well developed in no acute distress HEENT: Normal NECK: No JVD; No carotid bruits LYMPHATICS: No lymphadenopathy CARDIAC: RRR, no murmurs, rubs, gallops RESPIRATORY:  Clear to auscultation without rales, wheezing or rhonchi  ABDOMEN: Soft, non-tender, non-distended MUSCULOSKELETAL:  No edema; No deformity  SKIN: Warm and dry NEUROLOGIC:  Alert and oriented x 3 PSYCHIATRIC:  Normal affect   ASSESSMENT:    1. Coronary artery disease involving native coronary artery of native heart without angina pectoris   2. Mixed hyperlipidemia   3. Essential hypertension   4. Aortic atherosclerosis (HCC)    PLAN:    In order of problems listed above:  1. The patient appears stable.  Is been several  years since he has had an ischemic evaluation.  With his known occlusion of the LAD, I think it would be reasonable to repeat a nuclear stress test.  Will wait for his back to heal so that he can perform a good exercise study. 2. The patient is treated with a statin drug.  Lipids are followed by his primary physician. 3. Blood pressure is elevated.  Past visits have also shown an elevated blood pressure.  He tells  me his systolic blood pressure ranges from 135 to 145 mmHg on average.  I recommended that he add amlodipine 5 mg daily.  I thought it would be best to avoid a beta-blocker with his slow resting heart rate. 4. The patient brought in an x-ray of his back.  He has marked aortic calcification involving the abdominal aorta.  I have recommended an abdominal aortic ultrasound to rule out aneurysm or significant stenosis of the aorta.   Medication Adjustments/Labs and Tests Ordered: Current medicines are reviewed at length with the patient today.  Concerns regarding medicines are outlined above.  Orders Placed This Encounter  Procedures  . EKG 12-Lead   Meds ordered this encounter  Medications  . amLODipine (NORVASC) 5 MG tablet    Sig: Take 1 tablet (5 mg total) by mouth daily.    Dispense:  90 tablet    Refill:  3    Patient Instructions  Medication Instructions:  1) START AMLODIPINE (Norvasc) 5 mg daily  Labwork: None  Testing/Procedures: Your physician has requested that you have an abdominal aorta duplex. During this test, an ultrasound is used to evaluate the aorta. Allow 30 minutes for this exam. Do not eat after midnight the day before and avoid carbonated beverages  Please call when you are ready to schedule your nuclear stress test (when your back is better).  Follow-Up: Your provider wants you to follow-up in: 1 year with Dr. Excell Seltzer. You will receive a reminder letter in the mail two months in advance. If you don't receive a letter, please call our office to schedule  the follow-up appointment.        Signed, Tonny Bollman, MD  12/16/2018 5:28 PM    [facility]

## 2018-12-31 ENCOUNTER — Ambulatory Visit (HOSPITAL_COMMUNITY)
Admission: RE | Admit: 2018-12-31 | Discharge: 2018-12-31 | Disposition: A | Discharge status: Home / Self Care | Payer: Medicare Other | Source: Ambulatory Visit | Attending: Cardiology | Admitting: Cardiology

## 2018-12-31 DIAGNOSIS — I700 Atherosclerosis of aorta: Secondary | ICD-10-CM | POA: Diagnosis present

## 2019-04-01 DIAGNOSIS — M51369 Other intervertebral disc degeneration, lumbar region without mention of lumbar back pain or lower extremity pain: Secondary | ICD-10-CM | POA: Insufficient documentation

## 2019-04-21 ENCOUNTER — Ambulatory Visit: Payer: Medicare Other | Admitting: Cardiovascular Disease

## 2019-10-12 ENCOUNTER — Other Ambulatory Visit: Payer: Self-pay | Admitting: Cardiovascular Disease

## 2019-10-28 ENCOUNTER — Telehealth: Payer: Self-pay | Admitting: Cardiovascular Disease

## 2019-10-28 DIAGNOSIS — I2510 Atherosclerotic heart disease of native coronary artery without angina pectoris: Secondary | ICD-10-CM

## 2019-10-28 NOTE — Telephone Encounter (Signed)
New Message  Patient states that it is time for him to have a myocardial perfusion stress test. Patient would like to have it done prior to his appointment on 12/20/2019. Please put order in for stress test so patient can be scheduled.

## 2019-10-28 NOTE — Telephone Encounter (Signed)
Per Dr. Antionette Char 12/16/18 OV note,  PLAN:    In order of problems listed above:  1. The patient appears stable.  Is been several years since he has had an ischemic evaluation.  With his known occlusion of the LAD, I think it would be reasonable to repeat a nuclear stress test.  Will wait for his back to heal so that he can perform a good exercise study.  The patient is now calling in requesting to schedule study prior to his 12/20/2019 visit. Order placed. Will contact patient to review instructions once scheduled.

## 2019-11-01 NOTE — Telephone Encounter (Signed)
Reviewed instructions with patient. He has no questions. He was grateful for assistance.

## 2019-11-03 ENCOUNTER — Telehealth (HOSPITAL_COMMUNITY): Payer: Self-pay

## 2019-11-03 NOTE — Telephone Encounter (Signed)
Attempted to contact the patient, instructions left on his answering machine. Asked to call back with any questions. S.Brandolyn Shortridge EMTP

## 2019-11-05 ENCOUNTER — Other Ambulatory Visit (HOSPITAL_COMMUNITY): Payer: Medicare Other

## 2019-11-09 ENCOUNTER — Encounter (HOSPITAL_COMMUNITY): Payer: Medicare Other

## 2019-11-15 ENCOUNTER — Other Ambulatory Visit (HOSPITAL_COMMUNITY)
Admission: RE | Admit: 2019-11-15 | Discharge: 2019-11-15 | Disposition: A | Discharge status: Home / Self Care | Payer: Medicare Other | Source: Ambulatory Visit | Attending: Cardiovascular Disease | Admitting: Cardiovascular Disease

## 2019-11-15 DIAGNOSIS — Z20828 Contact with and (suspected) exposure to other viral communicable diseases: Secondary | ICD-10-CM | POA: Diagnosis not present

## 2019-11-15 DIAGNOSIS — Z01812 Encounter for preprocedural laboratory examination: Secondary | ICD-10-CM | POA: Insufficient documentation

## 2019-11-16 LAB — NOVEL CORONAVIRUS, NAA (HOSP ORDER, SEND-OUT TO REF LAB; TAT 18-24 HRS): SARS-CoV-2, NAA: NOT DETECTED

## 2019-11-18 ENCOUNTER — Other Ambulatory Visit: Payer: Self-pay

## 2019-11-18 ENCOUNTER — Ambulatory Visit (HOSPITAL_COMMUNITY): Payer: Medicare Other | Attending: Cardiology

## 2019-11-18 DIAGNOSIS — I2510 Atherosclerotic heart disease of native coronary artery without angina pectoris: Secondary | ICD-10-CM | POA: Diagnosis not present

## 2019-11-18 LAB — MYOCARDIAL PERFUSION IMAGING
Estimated workload: 13 METS
Exercise duration (min): 10 min
Exercise duration (sec): 45 s
LV dias vol: 91 mL (ref 62–150)
LV sys vol: 44 mL
MPHR: 146 {beats}/min
Peak HR: 139 {beats}/min
Percent HR: 95 %
Rest HR: 50 {beats}/min
SDS: 1
SRS: 0
SSS: 1
TID: 1.04

## 2019-11-18 MED ORDER — TECHNETIUM TC 99M TETROFOSMIN IV KIT
9.9000 | PACK | Freq: Once | INTRAVENOUS | Status: AC | PRN
  Administered 2019-11-18: 9.9 via INTRAVENOUS
  Filled 2019-11-18: qty 10

## 2019-11-18 MED ORDER — TECHNETIUM TC 99M TETROFOSMIN IV KIT
31.8000 | PACK | Freq: Once | INTRAVENOUS | Status: AC | PRN
  Administered 2019-11-18: 31.8 via INTRAVENOUS
  Filled 2019-11-18: qty 32

## 2019-12-09 ENCOUNTER — Telehealth: Payer: Self-pay | Admitting: Cardiovascular Disease

## 2019-12-09 NOTE — Telephone Encounter (Signed)
New Message  We are recommending the COVID-19 vaccine to all of our patients. Cardiac medications (including blood thinners) should not deter anyone from being vaccinated and there is no need to hold any of those medications prior to vaccine administration.     Currently, there is a hotline to call (active 11/26/19) to schedule vaccination appointments as no walk-ins will be accepted.   Number: [phone].    If an appointment is not available please go to [email or web address] to sign up for notification when additional vaccine appointments are available.   If you have further questions or concerns about the vaccine process, please visit [email or web address] or contact your primary care physician.   

## 2019-12-20 ENCOUNTER — Encounter: Payer: Self-pay | Admitting: Cardiovascular Disease

## 2019-12-20 ENCOUNTER — Ambulatory Visit (INDEPENDENT_AMBULATORY_CARE_PROVIDER_SITE_OTHER): Payer: Medicare Other | Admitting: Cardiovascular Disease

## 2019-12-20 ENCOUNTER — Other Ambulatory Visit: Payer: Self-pay

## 2019-12-20 VITALS — BP 148/82 | HR 62 | Ht 71.0 in | Wt 188.8 lb

## 2019-12-20 DIAGNOSIS — I2510 Atherosclerotic heart disease of native coronary artery without angina pectoris: Secondary | ICD-10-CM

## 2019-12-20 DIAGNOSIS — I10 Essential (primary) hypertension: Secondary | ICD-10-CM

## 2019-12-20 DIAGNOSIS — E782 Mixed hyperlipidemia: Secondary | ICD-10-CM | POA: Diagnosis not present

## 2019-12-20 MED ORDER — AMLODIPINE BESYLATE 10 MG PO TABS: 10.0000 mg | ORAL_TABLET | Freq: Every day | ORAL | 3 refills | Status: DC

## 2019-12-20 NOTE — Progress Notes (Signed)
Cardiology Office Note:    Date:  12/20/2019   ID:  Luis Abed, DOB [birth year], MRN [id number]  PCP:  Johny Blamer, MD  Cardiologist:  Tonny Bollman, MD  Electrophysiologist:  None   Referring MD: Johny Blamer, MD   Chief Complaint  Patient presents with  . Coronary Artery Disease    History of Present Illness:    Austin Odom is a 74 y.o. male with a hx of coronary artery disease, presenting for follow-up evaluation. The patient has a history of remote PCI in the 1990s.  He has known chronic occlusion of the LAD and has undergone PTCA of the diagonal branches.  On past assessment he has had normal LV systolic function.  He has been managed medically with no recurrent ischemic events.  The patient is here alone today.  He plays tennis regularly with no exertional symptoms.  He specifically denies chest pain, shortness of breath, edema, or heart palpitations.  He is compliant with his medications.  His primary limitations relate to osteoarthritis but he is able to stay quite active.  Past Medical History:  Diagnosis Date  . Coronary artery disease   . Hypothyroidism     Past Surgical History:  Procedure Laterality Date  . 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 GASTROC RECESSION EXTREMITY Right 08/17/2015   Procedure: RIGHT GASTROC RECESSION;  Surgeon: Toni Arthurs, MD;  Location: [facility];  Service: Orthopedics;  Laterality: Right;  . stents    . TONSILLECTOMY      Current Medications: Current Meds  Medication Sig  . amLODipine (NORVASC) 10 MG tablet Take 1 tablet (10 mg total) by mouth daily.  Marland Kitchen ascorbic acid (VITAMIN C) 1000 MG tablet Take 1,000 mg by mouth daily.  Marland Kitchen aspirin EC 81 MG tablet Take 1 tablet  (81 mg total) by mouth daily.  Marland Kitchen atorvastatin (LIPITOR) 80 MG tablet Take 80 mg by mouth daily.  . cetirizine (ZYRTEC) 10 MG tablet Take 1 tablet by mouth daily as needed for allergies.   . Coenzyme Q10 (CO Q10) 100 MG CAPS Take 1 capsule by mouth daily.   . fish oil-omega-3 fatty acids 1000 MG capsule Take 2 g by mouth daily.  . fluticasone (FLONASE) 50 MCG/ACT nasal spray Place 2 sprays into both nostrils 2 (two) times daily as needed for allergies.   Marland Kitchen GLUCOSAMINE SULFATE PO Take 1 capsule by mouth daily. Taking 1000 daily  . levothyroxine (SYNTHROID, LEVOTHROID) 100 MCG tablet Take 100 mcg by mouth daily before breakfast.   . meloxicam (MOBIC) 7.5 MG tablet Take 1 tablet by mouth daily as needed for pain.   . Menaquinone-7 (VITAMIN K2 PO) Take by mouth daily.  . Multiple Vitamins-Minerals (ZINC PO) Take by mouth daily.  . nitroGLYCERIN (NITROSTAT) 0.4 MG SL tablet Place 0.4 mg under the tongue as directed. Place 1 tablet under the tongue every 5 minutes as needed for chest pain (do not exceed 3 doses)  . [DISCONTINUED] amLODipine (NORVASC) 5 MG tablet TAKE 1 TABLET BY MOUTH EVERY DAY     Allergies:   Patient has no known allergies.   Social History   Socioeconomic History  . Marital status: Married    Spouse name: Not on file  . Number of children: Not on file  . Years  of education: Not on file  . Highest education level: Not on file  Occupational History  . Not on file  Tobacco Use  . Smoking status: Former Games developer  . Smokeless tobacco: Never Used  Substance and Sexual Activity  . Alcohol use: Yes    Comment: occas  . Drug use: No  . Sexual activity: Not on file  Other Topics Concern  . Not on file  Social History Narrative  . Not on file   Social Determinants of Health   Financial Resource Strain:   . Difficulty of Paying Living Expenses: Not on file  Food Insecurity:   . Worried About Programme researcher, broadcasting/film/video in the Last Year: Not on file  . Ran Out of Food in the Last  Year: Not on file  Transportation Needs:   . Lack of Transportation (Medical): Not on file  . Lack of Transportation (Non-Medical): Not on file  Physical Activity:   . Days of Exercise per Week: Not on file  . Minutes of Exercise per Session: Not on file  Stress:   . Feeling of Stress : Not on file  Social Connections:   . Frequency of Communication with Friends and Family: Not on file  . Frequency of Social Gatherings with Friends and Family: Not on file  . Attends Religious Services: Not on file  . Active Member of Clubs or Organizations: Not on file  . Attends Banker Meetings: Not on file  . Marital Status: Not on file     Family History: The patient's family history includes Heart disease in his father; Hypothyroidism in his mother; Stroke in his mother.  ROS:   Please see the history of present illness.    All other systems reviewed and are negative.  EKGs/Labs/Other Studies Reviewed:    The following studies were reviewed today: Myoview Stress Test 11-18-2019: Study Highlights    The left ventricular ejection fraction is normal (55-65%).  Nuclear stress EF: 55%.  Blood pressure demonstrated a hypertensive response to exercise.  Upsloping ST segment depression ST segment depression of 1 mm was noted during stress in the II and III leads, beginning at 7 minutes of stress, and returning to baseline after less than 1 minute of recovery.  The study is normal.  This is a low risk study.   Normal pharmacologic nuclear stress test with no evidence for prior infarct or ischemia. Normal LVEF. Hypertensive response to the exercise.   EKG:  EKG is ordered today.  The ekg ordered today demonstrates sinus brady 54 bpm, otherwise normal  Recent Labs: No results found for requested labs within last 8760 hours.  Recent Lipid Panel No results found for: CHOL, TRIG, HDL, CHOLHDL, VLDL, LDLCALC, LDLDIRECT  Physical Exam:    VS:  BP (!) 148/82   Pulse 62   Ht  5\' 11"  (1.803 m)   Wt 188 lb 12.8 oz (85.6 kg)   SpO2 99%   BMI 26.33 kg/m     Wt Readings from Last 3 Encounters:  12/20/19 188 lb 12.8 oz (85.6 kg)  11/18/19 191 lb (86.6 kg)  12/16/18 191 lb 3.2 oz (86.7 kg)     GEN:  Well nourished, well developed in no acute distress HEENT: Normal NECK: No JVD; No carotid bruits LYMPHATICS: No lymphadenopathy CARDIAC: RRR, no murmurs, rubs, gallops RESPIRATORY:  Clear to auscultation without rales, wheezing or rhonchi  ABDOMEN: Soft, non-tender, non-distended MUSCULOSKELETAL:  No edema; No deformity  SKIN: Warm and dry NEUROLOGIC:  Alert and oriented x 3 PSYCHIATRIC:  Normal affect   ASSESSMENT:    1. Essential hypertension   2. Coronary artery disease involving native coronary artery of native heart without angina pectoris   3. Mixed hyperlipidemia    PLAN:    In order of problems listed above:  1. Blood pressure control is suboptimal.  Home readings are ranging in the 140-150s over 70-80s.  I recommended that he increase amlodipine to 10 mg daily.  He will continue to monitor his blood pressure. 2. Patient is stable with no symptoms of angina.  He recently underwent stress testing that was completely normal with normal LVEF and normal myocardial perfusion.  He will continue on his current medical program. 3. Lipids are excellent, on last check with his primary care physician his total cholesterol was 158, HDL 77, LDL 67.  He is compliant with his statin drug and very healthy lifestyle.  Medication Adjustments/Labs and Tests Ordered: Current medicines are reviewed at length with the patient today.  Concerns regarding medicines are outlined above.  Orders Placed This Encounter  Procedures  . EKG 12-Lead   Meds ordered this encounter  Medications  . amLODipine (NORVASC) 10 MG tablet    Sig: Take 1 tablet (10 mg total) by mouth daily.    Dispense:  90 tablet    Refill:  3    Patient Instructions  Medication Instructions:  1)  INCREASE AMLODIPINE to 10 mg daily *If you need a refill on your cardiac medications before your next appointment, please call your pharmacy*   Follow-Up: At [facility], you and your health needs are our priority.  As part of our continuing mission to provide you with exceptional heart care, we have created designated Provider Care Teams.  These Care Teams include your primary Cardiologist (physician) and Advanced Practice Providers (APPs -  Physician Assistants and Nurse Practitioners) who all work together to provide you with the care you need, when you need it. Your next appointment:   12 month(s) The format for your next appointment:   In Person Provider:   You may see Sherren Mocha, MD or one of the following Advanced Practice Providers on your designated Care Team:    Richardson Dopp, PA-C  Vin Fessenden, PA-C  Daune Perch, [place]    Signed, Sherren Mocha, MD  12/20/2019 4:49 PM    [place]

## 2019-12-20 NOTE — Patient Instructions (Addendum)
Medication Instructions:  1) INCREASE AMLODIPINE to 10 mg daily *If you need a refill on your cardiac medications before your next appointment, please call your pharmacy*   Follow-Up: At [facility], you and your health needs are our priority.  As part of our continuing mission to provide you with exceptional heart care, we have created designated Provider Care Teams.  These Care Teams include your primary Cardiologist (physician) and Advanced Practice Providers (APPs -  Physician Assistants and Nurse Practitioners) who all work together to provide you with the care you need, when you need it. Your next appointment:   12 month(s) The format for your next appointment:   In Person Provider:   You may see Tonny Bollman, MD or one of the following Advanced Practice Providers on your designated Care Team:    Tereso Newcomer, PA-C  Vin Weston, [place]  Berton Bon, [place]

## 2020-01-31 DIAGNOSIS — M25561 Pain in right knee: Secondary | ICD-10-CM | POA: Insufficient documentation

## 2020-03-17 DIAGNOSIS — M7711 Lateral epicondylitis, right elbow: Secondary | ICD-10-CM | POA: Insufficient documentation

## 2020-03-17 DIAGNOSIS — M25521 Pain in right elbow: Secondary | ICD-10-CM | POA: Insufficient documentation

## 2020-11-13 ENCOUNTER — Ambulatory Visit
Admission: RE | Admit: 2020-11-13 | Discharge: 2020-11-13 | Disposition: A | Discharge status: Home / Self Care | Payer: Medicare Other | Source: Ambulatory Visit | Attending: Family Medicine | Admitting: Family Medicine

## 2020-11-13 ENCOUNTER — Other Ambulatory Visit: Payer: Self-pay | Admitting: Family Medicine

## 2020-11-13 DIAGNOSIS — R053 Chronic cough: Secondary | ICD-10-CM

## 2020-11-24 ENCOUNTER — Other Ambulatory Visit: Payer: Self-pay

## 2020-11-24 ENCOUNTER — Ambulatory Visit
Admission: RE | Admit: 2020-11-24 | Discharge: 2020-11-24 | Disposition: A | Discharge status: Home / Self Care | Payer: Medicare Other | Source: Ambulatory Visit | Attending: Family Medicine | Admitting: Family Medicine

## 2020-11-24 ENCOUNTER — Other Ambulatory Visit: Payer: Self-pay | Admitting: Family Medicine

## 2020-11-24 DIAGNOSIS — J189 Pneumonia, unspecified organism: Secondary | ICD-10-CM

## 2020-11-27 ENCOUNTER — Other Ambulatory Visit: Payer: Self-pay | Admitting: Family Medicine

## 2020-11-27 DIAGNOSIS — R053 Chronic cough: Secondary | ICD-10-CM

## 2020-12-07 ENCOUNTER — Other Ambulatory Visit: Payer: Self-pay | Admitting: Family Medicine

## 2020-12-07 DIAGNOSIS — J189 Pneumonia, unspecified organism: Secondary | ICD-10-CM

## 2020-12-27 ENCOUNTER — Other Ambulatory Visit: Payer: Self-pay

## 2020-12-27 ENCOUNTER — Ambulatory Visit
Admission: RE | Admit: 2020-12-27 | Discharge: 2020-12-27 | Disposition: A | Discharge status: Home / Self Care | Payer: Medicare Other | Source: Ambulatory Visit | Attending: Family Medicine | Admitting: Family Medicine

## 2020-12-27 DIAGNOSIS — J189 Pneumonia, unspecified organism: Secondary | ICD-10-CM

## 2020-12-30 ENCOUNTER — Other Ambulatory Visit: Payer: Self-pay | Admitting: Cardiovascular Disease

## 2021-01-27 ENCOUNTER — Other Ambulatory Visit: Payer: Self-pay | Admitting: Cardiovascular Disease

## 2021-02-09 ENCOUNTER — Other Ambulatory Visit: Payer: Self-pay | Admitting: Cardiovascular Disease

## 2021-02-27 ENCOUNTER — Other Ambulatory Visit: Payer: Self-pay

## 2021-02-27 MED ORDER — AMLODIPINE BESYLATE 10 MG PO TABS: 10.0000 mg | ORAL_TABLET | Freq: Every day | ORAL | 0 refills | Status: DC

## 2021-02-28 ENCOUNTER — Telehealth: Payer: Self-pay | Admitting: Cardiovascular Disease

## 2021-02-28 MED ORDER — AMLODIPINE BESYLATE 10 MG PO TABS: 10.0000 mg | ORAL_TABLET | Freq: Every day | ORAL | 0 refills | Status: DC

## 2021-02-28 NOTE — Telephone Encounter (Signed)
Pt's medication was sent to pt's pharmacy as requested. Confirmation received.  °

## 2021-02-28 NOTE — Telephone Encounter (Signed)
*  STAT* If patient is at the pharmacy, call can be transferred to refill team.   1. Which medications need to be refilled? (please list name of each medication and dose if known)  amLODipine (NORVASC) 10 MG tablet  2. Which pharmacy/location (including street and city if local pharmacy) is medication to be sent to? [id number] - [place], [place] - [address]  3. Do they need a 30 day or 90 day supply? 90   Patient prefers 90 day supply. PT is scheduled to see Tereso Newcomer 05/08/21

## 2021-04-04 ENCOUNTER — Ambulatory Visit (INDEPENDENT_AMBULATORY_CARE_PROVIDER_SITE_OTHER): Payer: Medicare Other | Admitting: Otolaryngology

## 2021-05-07 NOTE — Progress Notes (Addendum)
Cardiology Office Note:    Date:  05/08/2021   ID:  Austin Odom, DOB Apr 17, 1946, MRN [id number]  PCP:  Johny Blamer, MD   [facility] Providers Cardiologist:  Tonny Bollman, MD      Referring MD: Johny Blamer, MD   Chief Complaint:  Follow-up (CAD)    Patient Profile:    Austin Odom is a 75 y.o. male with:  Coronary artery disease  S/p remote PCI in 1990s Known CTO of LAD Hx of PTCA to Dx branches Myoview 10/2019: low risk Hypertension  Hyperlipidemia  Hypothyroidism Aortic atherosclerosis    Prior CV studies: GATED SPECT MYO PERF W/LEXISCAN STRESS 1D 11/18/2019 Narrative  The left ventricular ejection fraction is normal (55-65%).  Nuclear stress EF: 55%.  Blood pressure demonstrated a hypertensive response to exercise.  Upsloping ST segment depression ST segment depression of 1 mm was noted during stress in the II and III leads, beginning at 7 minutes of stress, and returning to baseline after less than 1 minute of recovery.  The study is normal.  This is a low risk study. Normal pharmacologic nuclear stress test with no evidence for prior infarct or ischemia. Normal LVEF. Hypertensive response to the exercise.  AAA U/S 01/01/2019 Summary:  Abdominal Aorta: No evidence of an abdominal aortic aneurysm was  visualized. 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 The largest aortic measurement  is 2.5 cm. 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 No  previous exam available for comparison.  R CIA > 50% prox L CIA and L EIA < 50   Event monitor 01/2014 Sinus rhythm, sinus tachy  History of Present Illness: Austin Odom was last seen in 2/21 by Dr. Excell Seltzer.  He returns for f/u.  He is here alone.  He plays tennis for exercise.  He has not had chest pain, shortness of breath.  He has not had syncope, orthopnea. He has some ankle edema from time to time since starting the Amlodipine.  This is not that bothersome for him.  He injured his L ankle years ago  and it tends to swell more.         Past Medical History:  Diagnosis Date   Aortic atherosclerosis (HCC)    Coronary artery disease    s/p remote PCI in 1990s // known CTO of LAD // hx of PTCA to Dx branches // Myoview 10/2019: low risk   HLD (hyperlipidemia)    HTN (hypertension)    Hypothyroidism     Current Medications: Current Meds  Medication Sig   ascorbic acid (VITAMIN C) 1000 MG tablet Take 1,000 mg by mouth daily.   aspirin EC 81 MG tablet Take 1 tablet (81 mg total) by mouth daily.   atorvastatin (LIPITOR) 80 MG tablet Take 80 mg by mouth daily.   cetirizine (ZYRTEC) 10 MG tablet Take 1 tablet by mouth daily as needed for allergies.    Cholecalciferol (VITAMIN D3) 125 MCG (5000 UT) CAPS Take 1 capsule by mouth daily.   Coenzyme Q10 (CO Q10) 100 MG CAPS Take 1 capsule by mouth daily.    fish oil-omega-3 fatty acids 1000 MG capsule Take 2 g by mouth daily.   fluticasone (FLONASE) 50 MCG/ACT nasal spray Place 2 sprays into both nostrils 2 (two) times daily as needed for allergies.    GLUCOSAMINE SULFATE PO Take 1 capsule by mouth daily. Taking 1000 daily   levothyroxine (SYNTHROID, LEVOTHROID) 100 MCG tablet Take 100 mcg by mouth daily before breakfast.  meloxicam (MOBIC) 7.5 MG tablet Take 1 tablet by mouth daily as needed for pain.    nitroGLYCERIN (NITROSTAT) 0.4 MG SL tablet Place 0.4 mg under the tongue as directed. Place 1 tablet under the tongue every 5 minutes as needed for chest pain (do not exceed 3 doses)   [DISCONTINUED] amLODipine (NORVASC) 10 MG tablet Take 1 tablet (10 mg total) by mouth daily. Please keep upcoming appt in June 2022 before anymore refills. Thank you     Allergies:   Patient has no known allergies.   Social History   Tobacco Use   Smoking status: Former    Pack years: 0.00   Smokeless tobacco: Never  Vaping Use   Vaping Use: Never used  Substance Use Topics   Alcohol use: Yes    Comment: occas   Drug use: No     Family Hx: The  patient's family history includes Heart disease in his father; Hypothyroidism in his mother; Stroke in his mother.  Review of Systems  Cardiovascular:  Negative for claudication.    EKGs/Labs/Other Test Reviewed:    EKG:  EKG is   ordered today.  The ekg ordered today demonstrates sinus brady, HR 59, normal axis, no ST-TW changes, QTc 419 ms  Recent Labs: No results found for requested labs within last 8760 hours.   Recent Lipid Panel No results found for: CHOL, TRIG, HDL, LDLCALC, LDLDIRECT  Labs obtained through [facility] - personally reviewed and interpreted: 03/12/2021: Total cholesterol 172, HDL 70, LDL 90, triglycerides 62, Hgb 14.8, creatinine 1.13, K+ 5, ALT 24, TSH 3.4   Risk Assessment/Calculations:      Physical Exam:    VS:  BP 132/72   Pulse (!) 59   Ht 5' 10.5" (1.791 m)   Wt 191 lb 3.2 oz (86.7 kg)   SpO2 96%   BMI 27.05 kg/m     Wt Readings from Last 3 Encounters:  05/08/21 191 lb 3.2 oz (86.7 kg)  12/20/19 188 lb 12.8 oz (85.6 kg)  11/18/19 191 lb (86.6 kg)     Constitutional:      Appearance: Healthy appearance. Not in distress.  Neck:     Vascular: No carotid bruit. JVD normal.  Pulmonary:     Effort: Pulmonary effort is normal.     Breath sounds: No wheezing. No rales.  Cardiovascular:     Normal rate. Regular rhythm. Normal S1. Normal S2.      Murmurs: There is no murmur.  Edema:    Peripheral edema present.    Ankle: bilateral trace edema of the ankle. Abdominal:     Palpations: Abdomen is soft. There is no hepatomegaly.  Skin:    General: Skin is warm and dry.  Neurological:     General: No focal deficit present.     Mental Status: Alert and oriented to person, place and time.     Cranial Nerves: Cranial nerves are intact.         ASSESSMENT & PLAN:    1. Coronary artery disease involving native coronary artery of native heart without angina pectoris Hx of remote PCI in the 1990s, PTCA to the diagonal branches and known CTO of the  LAD.  Myoview in 10/2019 was low risk.  He is doing well without angina.  Continue current dose of ASA, statin.  F/u in 1 year.    2. Essential hypertension Fair control.  We discussed BP goal of < 130/80.  If his BP runs much higher, it would  be reasonable to start a low dose thiazide diuretic which would also help his mild edema.  Continue current dose of Amlodipine. Continue to monitor.    3. Mixed hyperlipidemia Recent LDL 90.  We discussed the goal of < 70.  Continue high intensity statin Rx.  If LDL continues to remain > 70, consider switching to Rosuvastatin or adding Ezetimibe.    4. Aortic atherosclerosis (HCC) Continue statin Rx, ASA.        Dispo:  Return in about 1 year (around 05/08/2022) for Routine Follow Up, w/ Dr. Excell Seltzer, or Tereso Newcomer, PA-C.   Medication Adjustments/Labs and Tests Ordered: Current medicines are reviewed at length with the patient today.  Concerns regarding medicines are outlined above.  Tests Ordered: Orders Placed This Encounter  Procedures   EKG 12-Lead   Medication Changes: Meds ordered this encounter  Medications   amLODipine (NORVASC) 10 MG tablet    Sig: Take 1 tablet (10 mg total) by mouth daily.    Dispense:  90 tablet    Refill:  3    Signed, Tereso Newcomer, PA-C  05/08/2021 9:08 AM    [facility] [address] [place], [place], [place]  [address] Phone: [phone]; Fax: [phone]

## 2021-05-08 ENCOUNTER — Ambulatory Visit (INDEPENDENT_AMBULATORY_CARE_PROVIDER_SITE_OTHER): Payer: Medicare Other | Admitting: Physician Assistant

## 2021-05-08 ENCOUNTER — Other Ambulatory Visit: Payer: Self-pay

## 2021-05-08 ENCOUNTER — Encounter: Payer: Self-pay | Admitting: Physician Assistant

## 2021-05-08 VITALS — BP 132/72 | HR 59 | Ht 70.5 in | Wt 191.2 lb

## 2021-05-08 DIAGNOSIS — I700 Atherosclerosis of aorta: Secondary | ICD-10-CM

## 2021-05-08 DIAGNOSIS — E782 Mixed hyperlipidemia: Secondary | ICD-10-CM | POA: Diagnosis not present

## 2021-05-08 DIAGNOSIS — I10 Essential (primary) hypertension: Secondary | ICD-10-CM | POA: Diagnosis not present

## 2021-05-08 DIAGNOSIS — I2510 Atherosclerotic heart disease of native coronary artery without angina pectoris: Secondary | ICD-10-CM | POA: Diagnosis not present

## 2021-05-08 MED ORDER — AMLODIPINE BESYLATE 10 MG PO TABS: 10.0000 mg | ORAL_TABLET | Freq: Every day | ORAL | 3 refills | Status: DC

## 2021-05-08 NOTE — Patient Instructions (Signed)
Medication Instructions:  Your physician recommends that you continue on your current medications as directed. Please refer to the Current Medication list given to you today.  *If you need a refill on your cardiac medications before your next appointment, please call your pharmacy*   Lab Work: NONE   If you have labs (blood work) drawn today and your tests are completely normal, you will receive your results only by: . MyChart Message (if you have MyChart) OR . A paper copy in the mail If you have any lab test that is abnormal or we need to change your treatment, we will call you to review the results.   Testing/Procedures: NONE    Follow-Up: At [facility], you and your health needs are our priority.  As part of our continuing mission to provide you with exceptional heart care, we have created designated Provider Care Teams.  These Care Teams include your primary Cardiologist (physician) and Advanced Practice Providers (APPs -  Physician Assistants and Nurse Practitioners) who all work together to provide you with the care you need, when you need it.  We recommend signing up for the patient portal called "MyChart".  Sign up information is provided on this After Visit Summary.  MyChart is used to connect with patients for Virtual Visits (Telemedicine).  Patients are able to view lab/test results, encounter notes, upcoming appointments, etc.  Non-urgent messages can be sent to your provider as well.   To learn more about what you can do with MyChart, go to [email or web address].com.    Your next appointment:   1 year(s)  The format for your next appointment:   In Person  Provider:   You may see Austin Cooper, MD or one of the following Advanced Practice Providers on your designated Care Team:    Scott Weaver, PA-C    Other Instructions Your physician wants you to follow-up in: 1 year with Dr. Cooper or Scott Weaver, PA  You will receive a reminder letter in the mail two  months in advance. If you don't receive a letter, please call our office to schedule the follow-up appointment.   

## 2022-06-27 ENCOUNTER — Other Ambulatory Visit: Payer: Self-pay | Admitting: Physician Assistant

## 2022-07-23 ENCOUNTER — Other Ambulatory Visit: Payer: Self-pay | Admitting: Cardiovascular Disease

## 2022-08-02 ENCOUNTER — Other Ambulatory Visit: Payer: Self-pay | Admitting: Cardiovascular Disease

## 2022-08-15 ENCOUNTER — Telehealth: Payer: Self-pay

## 2022-08-15 NOTE — Patient Outreach (Signed)
  Care Coordination   Initial Visit Note   08/15/2022 Name: DELSHON BLANCHFIELD MRN: [id number] DOB: Jul 04, 1946  SALAR MOLDEN is a 76 y.o. year old male who sees Shirline Frees, MD for primary care. I spoke with  Tressa Busman by phone today.  What matters to the patients health and wellness today?  none    Goals Addressed             This Visit's Progress    COMPLETED: Care Coordination Activities-No follow up required       Care Coordination Interventions: Advised patient to schedule annual wellness visit. Patient scheduled for 10-17-22          SDOH assessments and interventions completed:  Yes     Care Coordination Interventions Activated:  Yes  Care Coordination Interventions:  Yes, provided   Follow up plan: No further intervention required.   Encounter Outcome:  Pt. Visit Completed   Jone Baseman, RN, MSN [facility] Care Management Coordinator Direct Line [phone]

## 2022-08-15 NOTE — Patient Instructions (Signed)
Visit Information  Thank you for taking time to visit with me today. Please don't hesitate to contact me if I can be of assistance to you.   Following are the goals we discussed today:   Goals Addressed             This Visit's Progress    COMPLETED: Care Coordination Activities-No follow up required       Care Coordination Interventions: Advised patient to schedule annual wellness visit. Patient scheduled for 10-17-22           If you are experiencing a Mental Health or [place] or need someone to talk to, please call the Suicide and Crisis Lifeline: 988   Patient verbalizes understanding of instructions and care plan provided today and agrees to view in [place]. Active MyChart status and patient understanding of how to access instructions and care plan via MyChart confirmed with patient.     No further follow up required: patient decline  Jone Baseman, RN, MSN [facility] Care Management Coordinator Direct Line [phone]

## 2022-08-27 ENCOUNTER — Other Ambulatory Visit: Payer: Self-pay | Admitting: Cardiovascular Disease

## 2022-08-31 ENCOUNTER — Other Ambulatory Visit: Payer: Self-pay | Admitting: Cardiovascular Disease

## 2022-09-10 ENCOUNTER — Other Ambulatory Visit: Payer: Self-pay | Admitting: Cardiovascular Disease

## 2022-09-14 ENCOUNTER — Other Ambulatory Visit: Payer: Self-pay | Admitting: Cardiovascular Disease

## 2022-09-19 DIAGNOSIS — M5416 Radiculopathy, lumbar region: Secondary | ICD-10-CM | POA: Insufficient documentation

## 2022-09-20 ENCOUNTER — Telehealth: Payer: Self-pay | Admitting: Cardiovascular Disease

## 2022-09-20 NOTE — Telephone Encounter (Signed)
Pt last seen here by Richardson Dopp, PA on 05/08/21 and advised to follow up in 1 year. Returned call to patient who states that he's not having any issues, but knows he's supposed to have yearly stress tests. Pt scheduled to see Scott on 09/24/22 and advised he will order the test (if he agrees) at that visit and it can be scheduled upon check-out. Pt appreciative.

## 2022-09-20 NOTE — Telephone Encounter (Signed)
Pt would like to have stress done since he hasn't had one since 2020

## 2022-09-23 DIAGNOSIS — I10 Essential (primary) hypertension: Secondary | ICD-10-CM | POA: Insufficient documentation

## 2022-09-23 DIAGNOSIS — I700 Atherosclerosis of aorta: Secondary | ICD-10-CM | POA: Insufficient documentation

## 2022-09-23 NOTE — Progress Notes (Unsigned)
Cardiology Office Note:    Date:  09/24/2022   ID:  Austin Odom, DOB [birth year], MRN [id number]  PCP:  Shirline Frees, [place] [facility] Cardiologist:  Sherren Mocha, MD    Referring MD: Shirline Frees, MD   Chief Complaint:  Follow-up for CAD    Patient Profile: Coronary artery disease  S/p remote PCI in 1990s Known CTO of LAD Hx of PTCA to Dx branches Myoview 10/2019: no infarct or ischemia; low risk, EF 55 Hypertension  Hyperlipidemia  Hypothyroidism Aortic atherosclerosis  Peripheral arterial disease  AAA [place] 01/01/19: no AAA, prox aorta ectatic, Abnl dilation of R CIA; R CIA > 50 prox; L CIA and L EIA < 50 Abd aorta ectasia  Event monitor 3/15: NSR, sinus tachy  Cardiac Studies & Procedures     STRESS TESTS  MYOCARDIAL PERFUSION IMAGING 11/18/2019  Narrative  The left ventricular ejection fraction is normal (55-65%).  Nuclear stress EF: 55%.  Blood pressure demonstrated a hypertensive response to exercise.  Upsloping ST segment depression ST segment depression of 1 mm was noted during stress in the II and III leads, beginning at 7 minutes of stress, and returning to baseline after less than 1 minute of recovery.  The study is normal.  This is a low risk study.  Normal pharmacologic nuclear stress test with no evidence for prior infarct or ischemia. Normal LVEF. Hypertensive response to the exercise.               History of Present Illness:   Austin Odom is a 76 y.o. male with the above problem list.  He was last seen in June 22. He returns for f/u.  He is here alone.  Since last seen, he has done well without chest pain, shortness of breath, syncope, orthopnea, significant leg edema.  He continues to play tennis 3 times a week without difficulty.  He is interested in having a follow-up stress test.        Past Medical History:  Diagnosis Date   Aortic atherosclerosis ([place])    Coronary artery disease    s/p remote PCI in 1990s  // known CTO of LAD // hx of PTCA to Dx branches // Myoview 10/2019: low risk   HLD (hyperlipidemia)    HTN (hypertension)    Hypothyroidism    Current Medications: Current Meds  Medication Sig   ascorbic acid (VITAMIN C) 1000 MG tablet Take 1,000 mg by mouth daily.   aspirin EC 81 MG tablet Take 1 tablet (81 mg total) by mouth daily.   atorvastatin (LIPITOR) 80 MG tablet Take 80 mg by mouth daily.   cetirizine (ZYRTEC) 10 MG tablet Take 1 tablet by mouth daily as needed for allergies.    Cholecalciferol (VITAMIN D3) 125 MCG (5000 UT) CAPS Take 1 capsule by mouth daily.   Coenzyme Q10 (CO Q10) 100 MG CAPS Take 1 capsule by mouth daily.    fish oil-omega-3 fatty acids 1000 MG capsule Take 2 g by mouth daily.   fluticasone (FLONASE) 50 MCG/ACT nasal spray Place 2 sprays into both nostrils 2 (two) times daily as needed for allergies.    GLUCOSAMINE SULFATE PO Take 1 capsule by mouth daily. Taking 1000 daily   levothyroxine (SYNTHROID, LEVOTHROID) 100 MCG tablet Take 100 mcg by mouth daily before breakfast.    meloxicam (MOBIC) 7.5 MG tablet Take 1 tablet by mouth daily as needed for pain.    nitroGLYCERIN (NITROSTAT) 0.4 MG SL tablet Place 0.4  mg under the tongue as directed. Place 1 tablet under the tongue every 5 minutes as needed for chest pain (do not exceed 3 doses)   [DISCONTINUED] amLODipine (NORVASC) 10 MG tablet Take 1 tablet (10 mg total) by mouth daily. PLEASE CONTACT OFFICE FOR ADDITIONAL REFILLS    Allergies:   Patient has no known allergies.   Social History   Tobacco Use   Smoking status: Former   Smokeless tobacco: Never  Building services engineer Use: Never used  Substance Use Topics   Alcohol use: Yes    Comment: occas   Drug use: No    Family Hx: The patient's family history includes Heart disease in his father; Hypothyroidism in his mother; Stroke in his mother.  Review of Systems  Cardiovascular:  Negative for claudication.     EKGs/Labs/Other Test Reviewed:     EKG:  EKG is   ordered today.  The ekg ordered today demonstrates sinus bradycardia, HR 52, normal axis, no ST-T wave changes, QTc 398   Recent Labs: No results found for requested labs within last 365 days.   Recent Lipid Panel No results for input(s): "CHOL", "TRIG", "HDL", "VLDL", "LDLCALC", "LDLDIRECT" in the last 8760 hours.    Risk Assessment/Calculations/Metrics:              Physical Exam:    VS:  BP (!) 124/50   Pulse (!) 52   Ht 5' 10.5" (1.791 m)   Wt 188 lb 3.2 oz (85.4 kg)   SpO2 98%   BMI 26.62 kg/m     Wt Readings from Last 3 Encounters:  09/24/22 188 lb 3.2 oz (85.4 kg)  05/08/21 191 lb 3.2 oz (86.7 kg)  12/20/19 188 lb 12.8 oz (85.6 kg)    Constitutional:      Appearance: Healthy appearance. Not in distress.  Neck:     Vascular: No carotid bruit. JVD normal.  Pulmonary:     Effort: Pulmonary effort is normal.     Breath sounds: No wheezing. No rales.  Cardiovascular:     Normal rate. Regular rhythm. Normal S1. Normal S2.      Murmurs: There is no murmur.  Edema:    Peripheral edema absent.  Abdominal:     Palpations: Abdomen is soft.  Skin:    General: Skin is warm and dry.  Neurological:     General: No focal deficit present.     Mental Status: Alert and oriented to person, place and time.         ASSESSMENT & PLAN:   Coronary artery disease involving native coronary artery of native heart without angina pectoris History of remote PCI with BMS to the RCA in 1998, chronically occluded distal LAD.  Myoview in 2020 was low risk.  He remains very active without anginal symptoms.  He plays tennis 3 times a week.  We discussed the role for nuclear stress testing versus routine exercise tolerance test.  I have recommended proceeding with a ETT for follow-up. Arrange ETT Continue aspirin 81 mg daily, Lipitor 80 mg daily.  Hyperlipidemia Labs from [facility] personally reviewed and interpreted. 04/03/22: Total cholesterol 145, HDL 50, LDL 77,  triglycerides 97, ALT 20 Fair control of lipids.  Continue Lipitor 80 mg daily.  Essential hypertension Blood pressure controlled.  Continue amlodipine 10 mg daily.  Ectatic abdominal aorta (HCC) Ultrasound in 2020 demonstrated no AAA but there was some abdominal aorta ectasia.  Consider repeat abdominal aorta ultrasound in 2025.  Aortic atherosclerosis (HCC)  Continue aspirin, statin therapy.  PAD (peripheral artery disease) (HCC) Ultrasound in 2020 demonstrated greater than 50% stenosis in the proximal right CIA and less than 50% stenosis in the left CIA and left EIA.  He does not have symptoms of claudication.        Shared Decision Making/Informed Consent The risks [chest pain, shortness of breath, cardiac arrhythmias, dizziness, blood pressure fluctuations, myocardial infarction, stroke/transient ischemic attack, and life-threatening complications (estimated to be 1 in 10,000)], benefits (risk stratification, diagnosing coronary artery disease, treatment guidance) and alternatives of an exercise tolerance test were discussed in detail with Mr. Hellmer and he agrees to proceed.   Dispo:  Return in about 1 year (around 09/25/2023) for Routine Follow Up, w/ Dr. Excell Seltzer, or Tereso Newcomer, PA-C.   Medication Adjustments/Labs and Tests Ordered: Current medicines are reviewed at length with the patient today.  Concerns regarding medicines are outlined above.  Tests Ordered: Orders Placed This Encounter  Procedures   Cardiac Stress Test: Informed Consent Details: Physician/Practitioner Attestation; Transcribe to consent form and obtain patient signature   Exercise Tolerance Test   EKG 12-Lead   Medication Changes: Meds ordered this encounter  Medications   amLODipine (NORVASC) 10 MG tablet    Sig: Take 1 tablet (10 mg total) by mouth daily.    Dispense:  90 tablet    Refill:  3   Signed, Tereso Newcomer, PA-C  09/24/2022 9:53 AM    [facility] [address] [place], [place], [place]   [address] Phone: [phone]; Fax: [phone]

## 2022-09-24 ENCOUNTER — Encounter: Payer: Self-pay | Admitting: Physician Assistant

## 2022-09-24 ENCOUNTER — Ambulatory Visit: Payer: Medicare Other | Attending: Physician Assistant | Admitting: Physician Assistant

## 2022-09-24 ENCOUNTER — Encounter: Payer: Self-pay | Admitting: *Deleted

## 2022-09-24 VITALS — BP 124/50 | HR 52 | Ht 70.5 in | Wt 188.2 lb

## 2022-09-24 DIAGNOSIS — I77811 Abdominal aortic ectasia: Secondary | ICD-10-CM | POA: Insufficient documentation

## 2022-09-24 DIAGNOSIS — E782 Mixed hyperlipidemia: Secondary | ICD-10-CM

## 2022-09-24 DIAGNOSIS — I10 Essential (primary) hypertension: Secondary | ICD-10-CM

## 2022-09-24 DIAGNOSIS — I2510 Atherosclerotic heart disease of native coronary artery without angina pectoris: Secondary | ICD-10-CM | POA: Diagnosis not present

## 2022-09-24 DIAGNOSIS — I700 Atherosclerosis of aorta: Secondary | ICD-10-CM | POA: Diagnosis not present

## 2022-09-24 DIAGNOSIS — I739 Peripheral vascular disease, unspecified: Secondary | ICD-10-CM | POA: Diagnosis present

## 2022-09-24 MED ORDER — AMLODIPINE BESYLATE 10 MG PO TABS: 10.0000 mg | ORAL_TABLET | Freq: Every day | ORAL | 3 refills | Status: DC

## 2022-09-24 NOTE — Assessment & Plan Note (Signed)
History of remote PCI with BMS to the RCA in 1998, chronically occluded distal LAD.  Myoview in 2020 was low risk.  He remains very active without anginal symptoms.  He plays tennis 3 times a week.  We discussed the role for nuclear stress testing versus routine exercise tolerance test.  I have recommended proceeding with a ETT for follow-up. Arrange ETT Continue aspirin 81 mg daily, Lipitor 80 mg daily.

## 2022-09-24 NOTE — Assessment & Plan Note (Signed)
Ultrasound in 2020 demonstrated greater than 50% stenosis in the proximal right CIA and less than 50% stenosis in the left CIA and left EIA.  He does not have symptoms of claudication.

## 2022-09-24 NOTE — Assessment & Plan Note (Signed)
Continue aspirin, statin therapy 

## 2022-09-24 NOTE — Assessment & Plan Note (Signed)
Blood pressure controlled.  Continue amlodipine 10 mg daily.

## 2022-09-24 NOTE — Assessment & Plan Note (Signed)
Ultrasound in 2020 demonstrated no AAA but there was some abdominal aorta ectasia.  Consider repeat abdominal aorta ultrasound in 2025.

## 2022-09-24 NOTE — Patient Instructions (Signed)
Medication Instructions:  Your physician recommends that you continue on your current medications as directed. Please refer to the Current Medication list given to you today.  *If you need a refill on your cardiac medications before your next appointment, please call your pharmacy*   Lab Work: None ordered  If you have labs (blood work) drawn today and your tests are completely normal, you will receive your results only by: [place] (if you have MyChart) OR A paper copy in the mail If you have any lab test that is abnormal or we need to change your treatment, we will call you to review the results.   Testing/Procedures: Your physician has requested that you have an exercise tolerance test. For further information please visit [email or web address]. Please also follow instruction sheet,  BELOW:  Dear Mr. Austin Odom are scheduled for an Exercise Stress Test   Please arrive 15 minutes prior to your appointment time for registration and insurance purposes.  The test will take approximately 45 minutes to complete.  How to prepare for your Exercise Stress Test: Do bring a list of your current medications with you.  If not listed below, you may take your medications as normal. Do wear comfortable clothes (no dresses or overalls) and walking shoes, tennis shoes preferred (no heels or open toed shoes are allowed) Do Not wear cologne, perfume, aftershave or lotions (deodorant is allowed). Please report to [place], [address], [address] for your test.  If these instructions are not followed, your test will have to be rescheduled.  If you have questions or concerns about your appointment, you can call the Stress Lab at [phone].  If you cannot keep your appointment, please provide 24 hours notification to the Stress Lab, to avoid a possible $50 charge to your account.  Follow-Up: At [facility], you and your health needs are our priority.  As part of our continuing  mission to provide you with exceptional heart care, we have created designated Provider Care Teams.  These Care Teams include your primary Cardiologist (physician) and Advanced Practice Providers (APPs -  Physician Assistants and Nurse Practitioners) who all work together to provide you with the care you need, when you need it.  We recommend signing up for the patient portal called "MyChart".  Sign up information is provided on this After Visit Summary.  MyChart is used to connect with patients for Virtual Visits (Telemedicine).  Patients are able to view lab/test results, encounter notes, upcoming appointments, etc.  Non-urgent messages can be sent to your provider as well.   To learn more about what you can do with MyChart, go to [email or web address].    Your next appointment:   1 year(s)  The format for your next appointment:   In Person  Provider:   Sherren Mocha, MD  or Richardson Dopp, PA-C         Other Instructions   Important Information About Sugar

## 2022-09-24 NOTE — Assessment & Plan Note (Signed)
Labs from [facility] personally reviewed and interpreted. 04/03/22: Total cholesterol 145, HDL 50, LDL 77, triglycerides 97, ALT 20 Fair control of lipids.  Continue Lipitor 80 mg daily.

## 2022-09-25 ENCOUNTER — Ambulatory Visit: Payer: Medicare Other | Attending: Physician Assistant

## 2022-09-25 DIAGNOSIS — I2510 Atherosclerotic heart disease of native coronary artery without angina pectoris: Secondary | ICD-10-CM

## 2022-09-25 DIAGNOSIS — I700 Atherosclerosis of aorta: Secondary | ICD-10-CM

## 2022-09-25 DIAGNOSIS — E782 Mixed hyperlipidemia: Secondary | ICD-10-CM

## 2022-09-25 DIAGNOSIS — I10 Essential (primary) hypertension: Secondary | ICD-10-CM | POA: Diagnosis not present

## 2022-09-27 ENCOUNTER — Encounter: Payer: Self-pay | Admitting: Physician Assistant

## 2022-09-27 LAB — EXERCISE TOLERANCE TEST
Angina Index: 0
Estimated workload: 10.1
Exercise duration (min): 9 min
Exercise duration (sec): 0 s
MPHR: 143 {beats}/min
Peak HR: 126 {beats}/min
Percent HR: 88 %
RPE: 16
Rest HR: 55 {beats}/min

## 2022-09-27 NOTE — Progress Notes (Signed)
Pt has been made aware of normal result and verbalized understanding.  jw

## 2022-10-22 ENCOUNTER — Ambulatory Visit: Payer: Medicare Other | Admitting: Physician Assistant

## 2022-11-19 IMAGING — CT CT CHEST W/O CM
1 series · 15 of 34 positions shown, 19 images · non-contrast
Comparison: May 03, 2015.

CLINICAL DATA: Pneumonia.

EXAM:
CT CHEST WITHOUT CONTRAST
TECHNIQUE: Multidetector CT imaging of the chest was performed following the
standard protocol without IV contrast.

[Series 2: chest w/(date) · axial · 0.73mm/px · z∈[-341,-41]mm · 15 of 176 slices shown, 19 images]
[im 13/176  mediastinal]
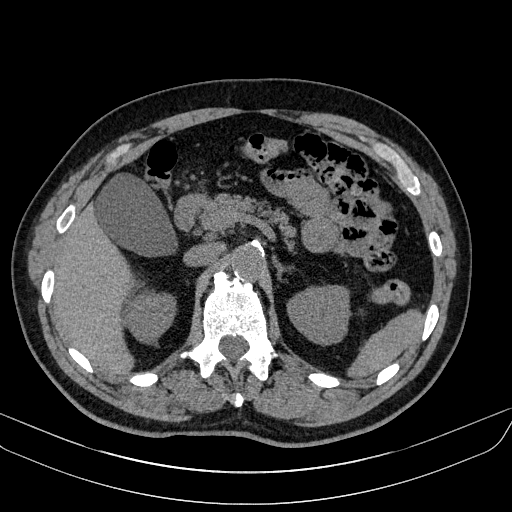
[im 13/176  lung]
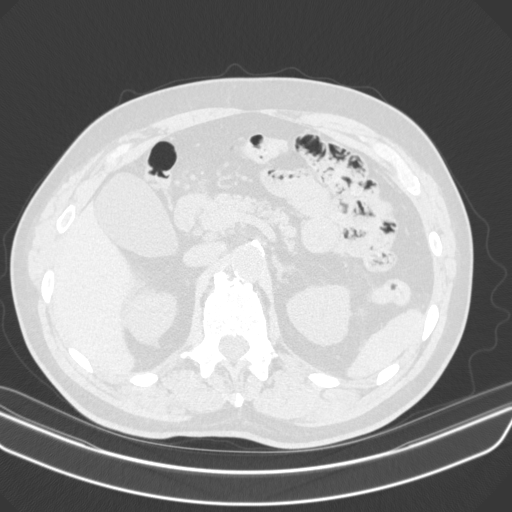
[im 26/176  lung]
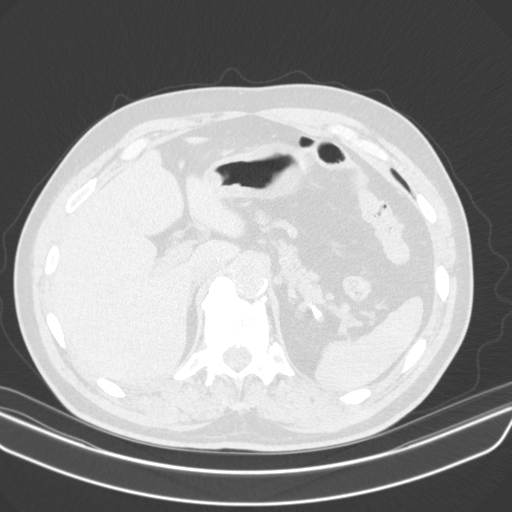
[im 36/176  lung]
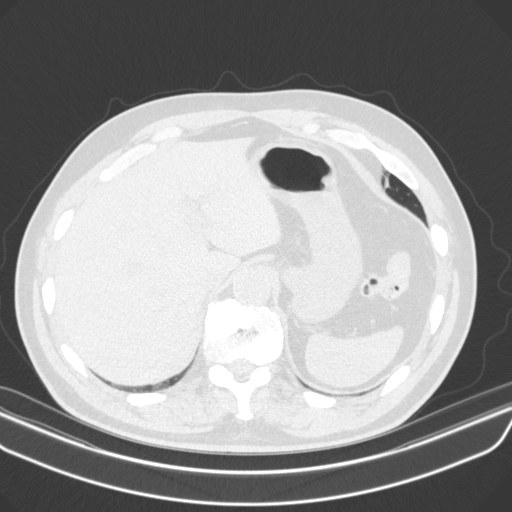
[im 46/176  lung]
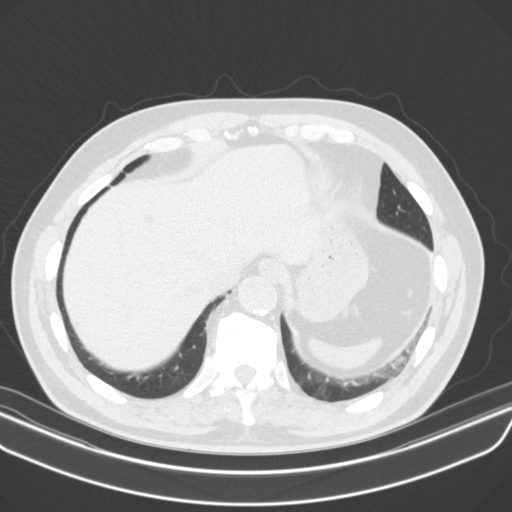
[im 59/176  mediastinal]
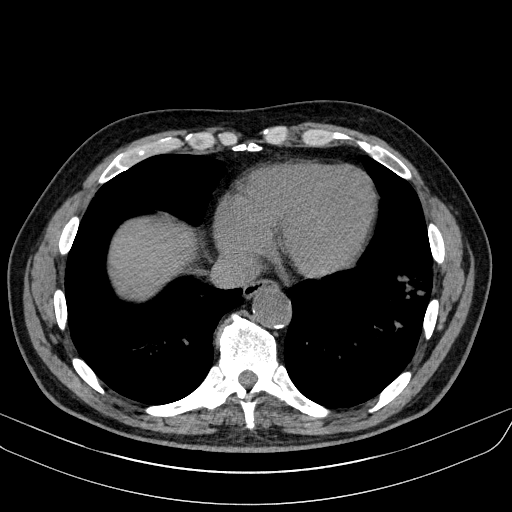
[im 59/176  lung]
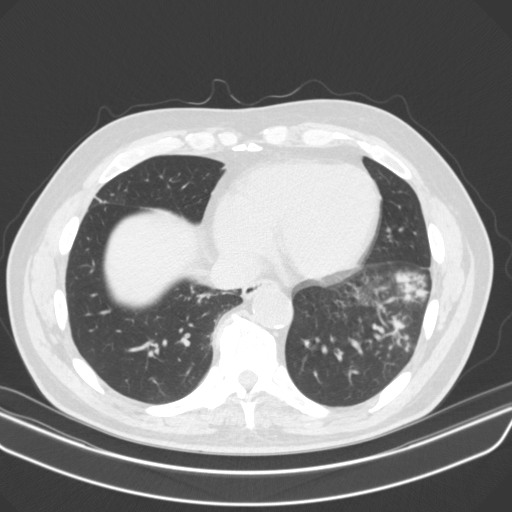
[im 71/176  lung]
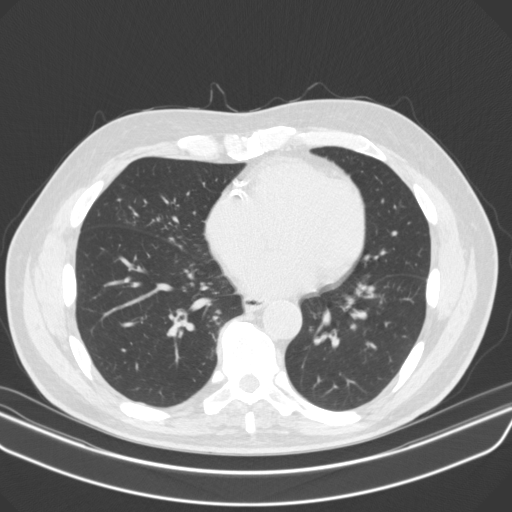
[im 78/176  lung]
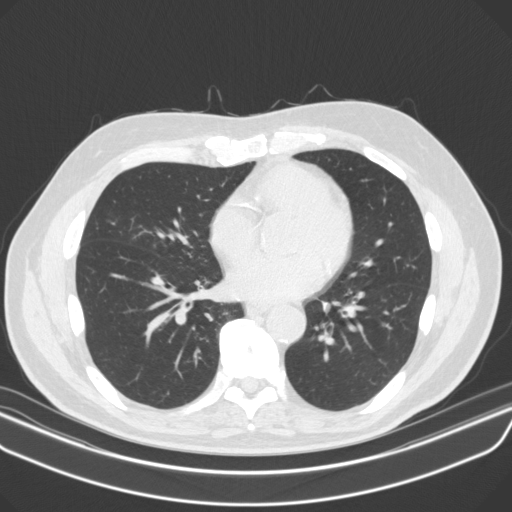
[im 91/176  lung]
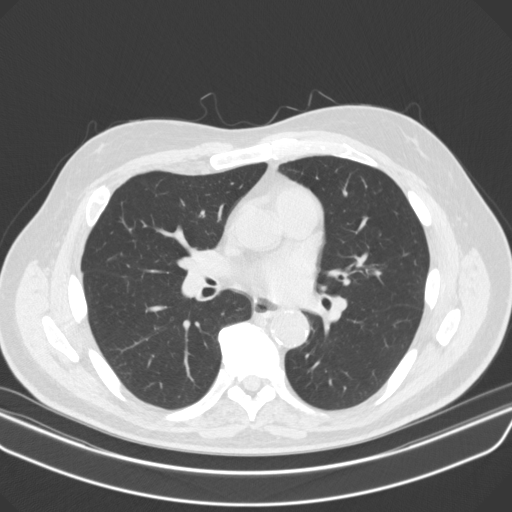
[im 98/176  mediastinal]
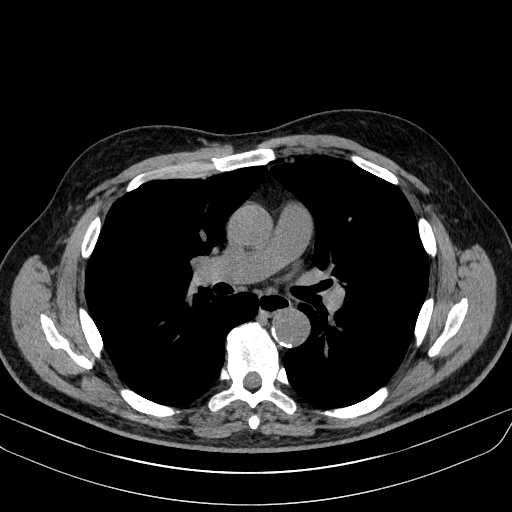
[im 98/176  lung]
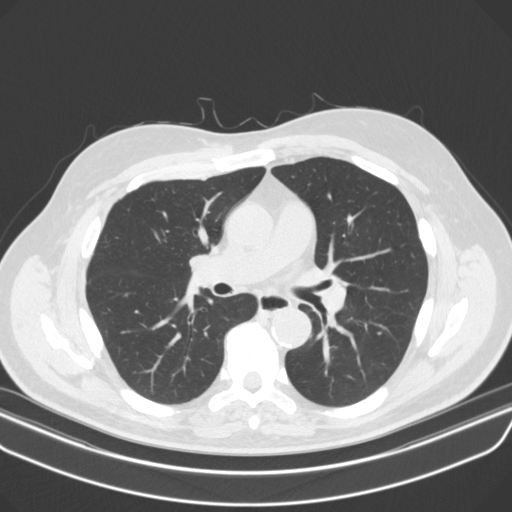
[im 106/176  lung]
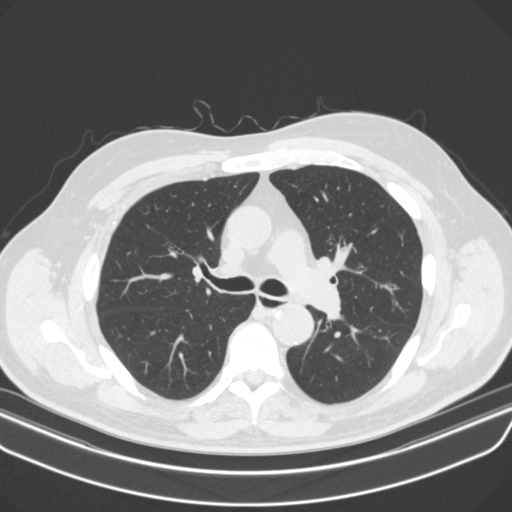
[im 117/176  lung]
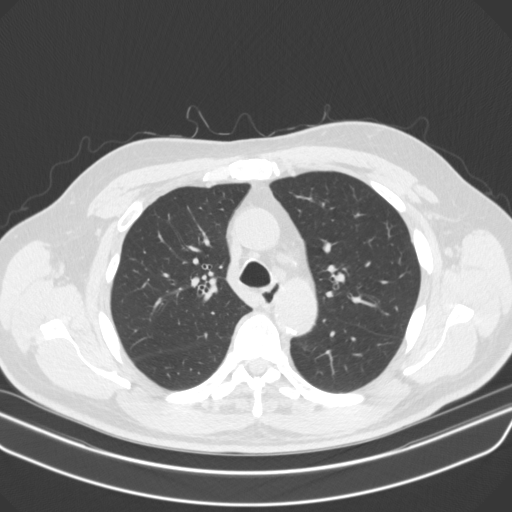
[im 130/176  lung]
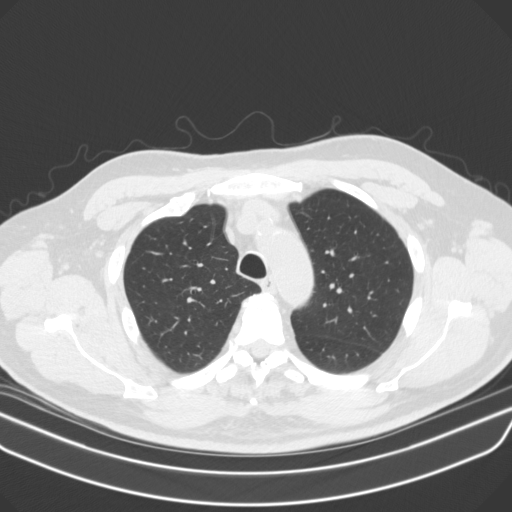
[im 141/176  mediastinal]
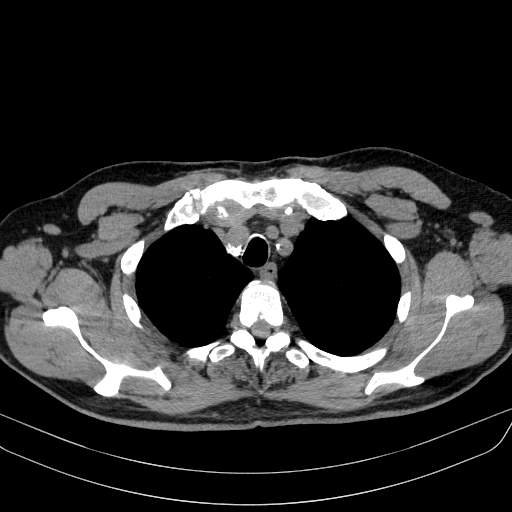
[im 141/176  lung]
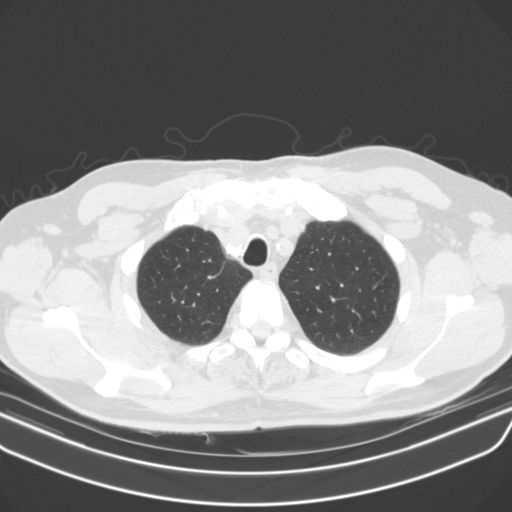
[im 150/176  lung]
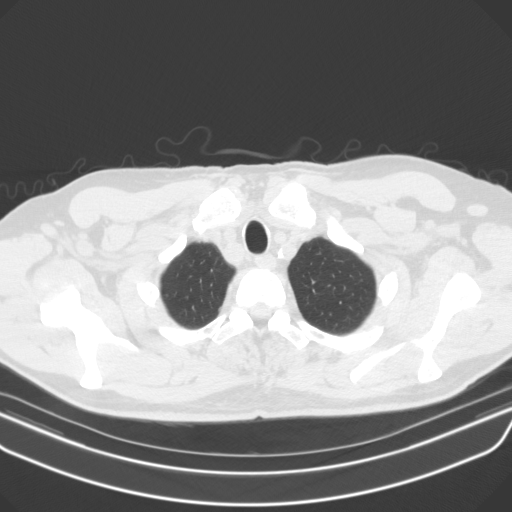
[im 163/176  lung]
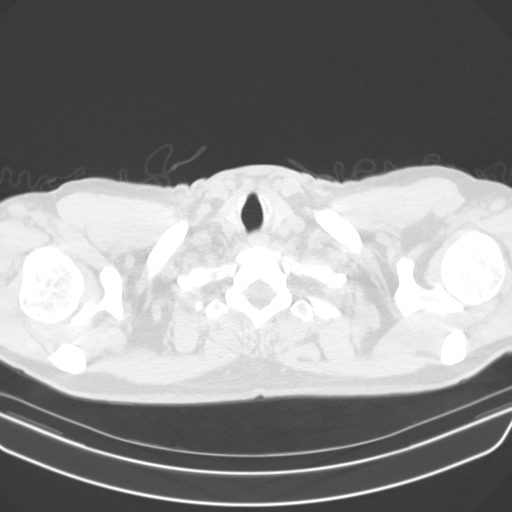

[15 of 34 positions shown; findings below may reference images not displayed]

FINDINGS: Cardiovascular: Atherosclerosis of thoracic aorta is noted without
aneurysm formation. Normal cardiac size. No pericardial effusion.
Coronary artery calcifications are noted.

Mediastinum/Nodes: No enlarged mediastinal or axillary lymph nodes.
Thyroid gland, trachea, and esophagus demonstrate no significant
findings.

Lungs/Pleura: No pneumothorax or pleural effusion is noted. Left
basilar opacity is noted most consistent with pneumonia. Ill-defined
densities are also noted in the inferior portion of the right lower
and middle lobes concerning for inflammation with possible aspirated
material within the right lower and middle lobe bronchi.

Upper Abdomen: No acute abnormality.

Musculoskeletal: No chest wall mass or suspicious bone lesions
identified.
IMPRESSION: 1. Left basilar opacity is noted most consistent with pneumonia.
2. Ill-defined densities are also noted in the inferior portion of
the right lower and middle lobes concerning for inflammation with
possible aspirated material within the right lower and middle lobe
bronchi.
3. Coronary artery calcifications are noted suggesting coronary
artery disease.
4. Aortic atherosclerosis.

Aortic Atherosclerosis ([id number]).

## 2022-12-22 IMAGING — CT CT CHEST W/O CM
1 series · 15 of 34 positions shown, 19 images · non-contrast
Comparison: 11/24/2020

CLINICAL DATA: Pneumonia, follow-up examination

EXAM:
CT CHEST WITHOUT CONTRAST
TECHNIQUE: Multidetector CT imaging of the chest was performed following the
standard protocol without IV contrast.

[Series 2: chest w/(date) · axial · 0.74mm/px · z∈[-322,-56]mm · 15 of 157 slices shown, 19 images]
[im 12/157  mediastinal]
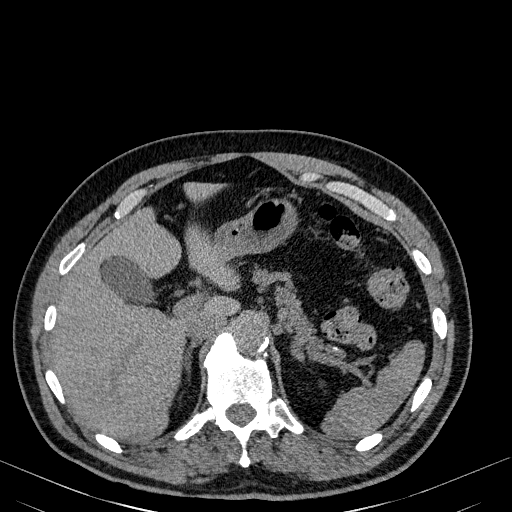
[im 12/157  lung]
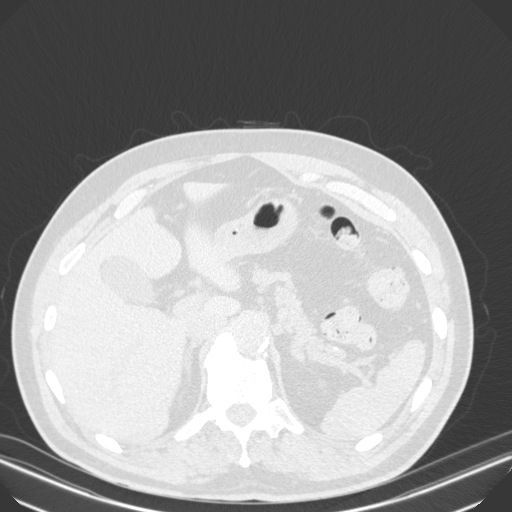
[im 24/157  lung]
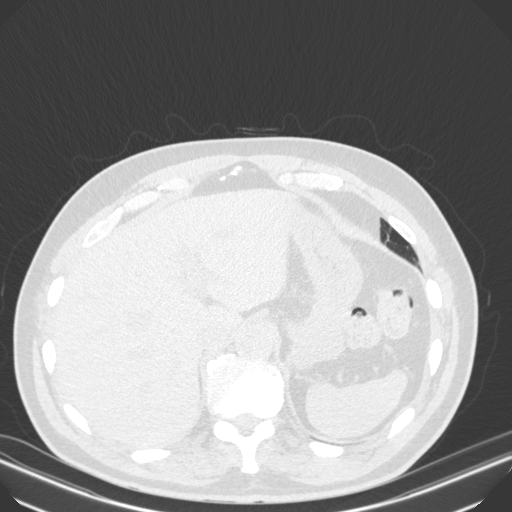
[im 32/157  lung]
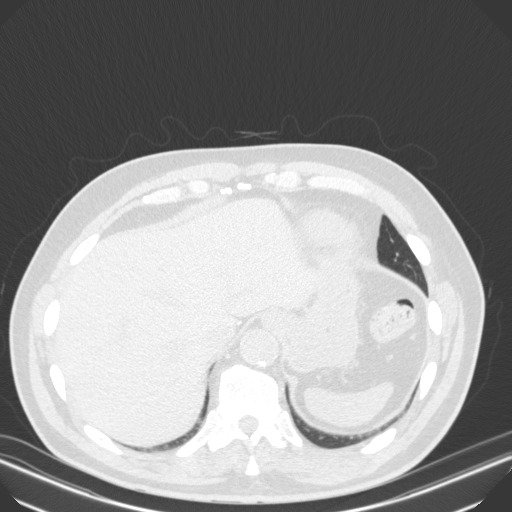
[im 41/157  lung]
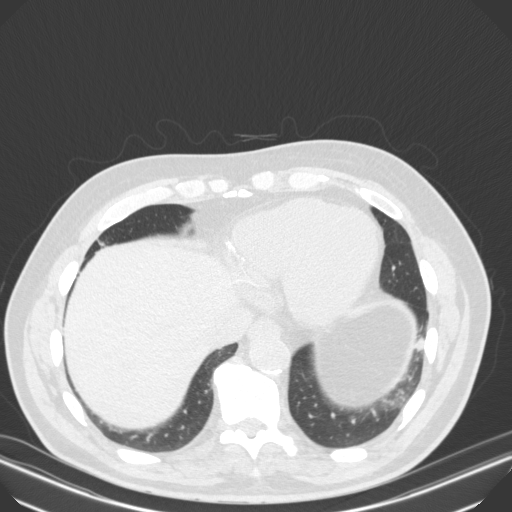
[im 53/157  mediastinal]
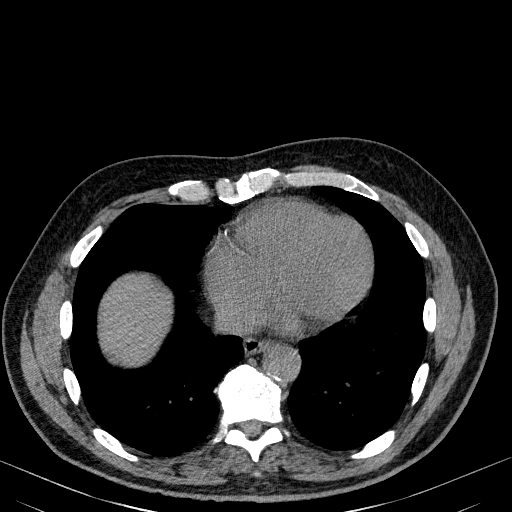
[im 53/157  lung]
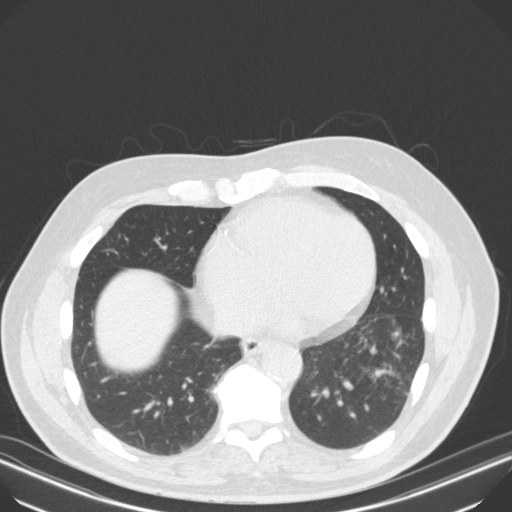
[im 63/157  lung]
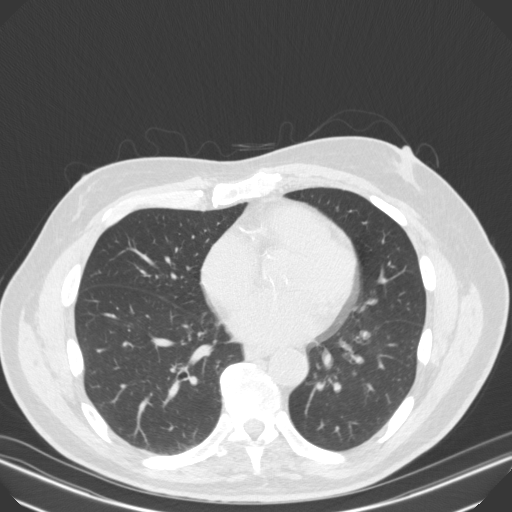
[im 70/157  lung]
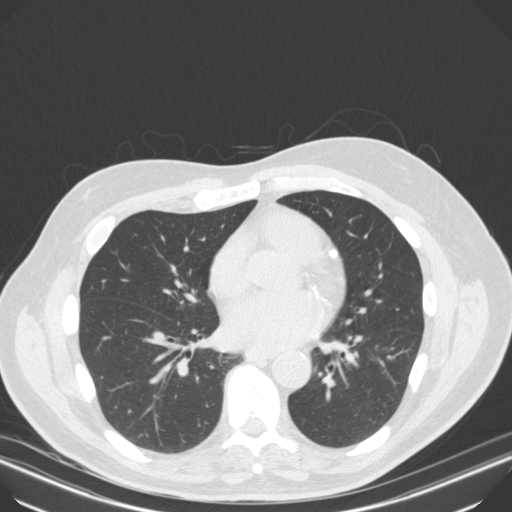
[im 81/157  lung]
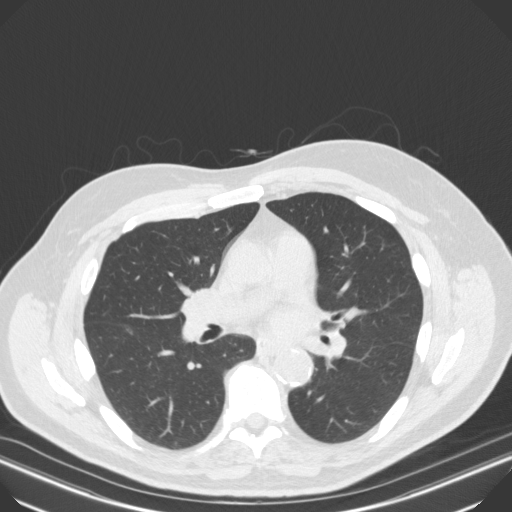
[im 87/157  mediastinal]
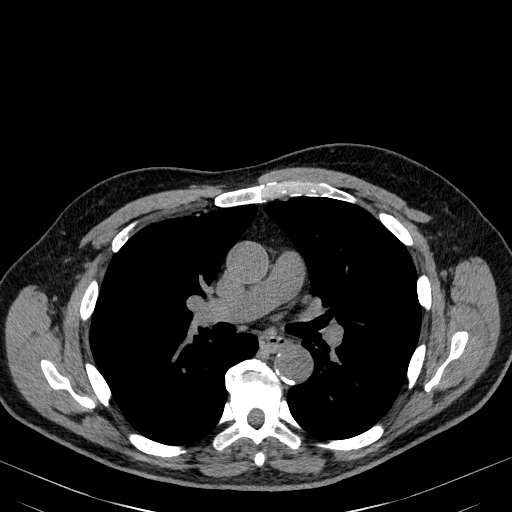
[im 87/157  lung]
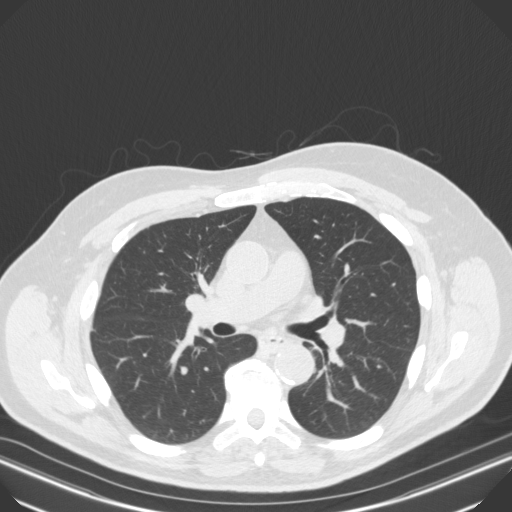
[im 94/157  lung]
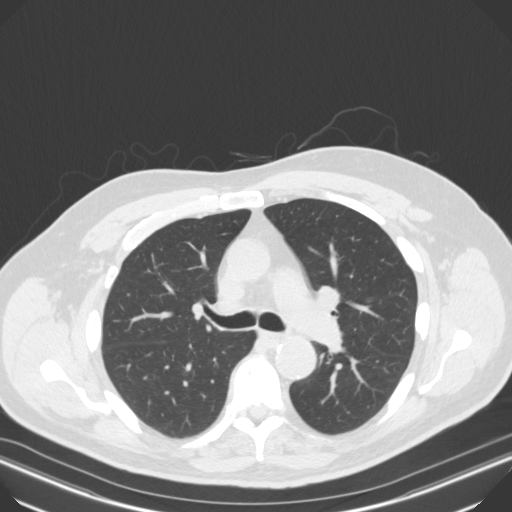
[im 105/157  lung]
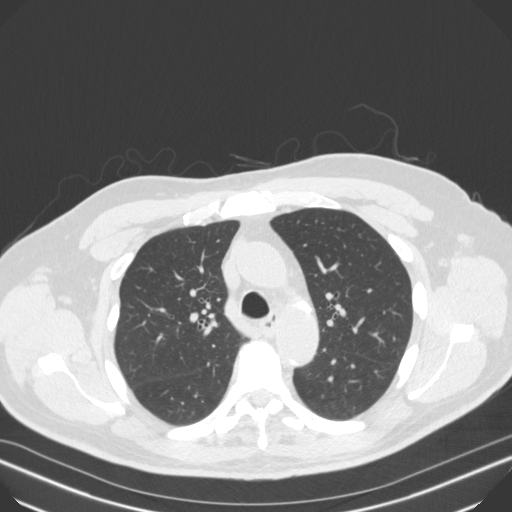
[im 116/157  lung]
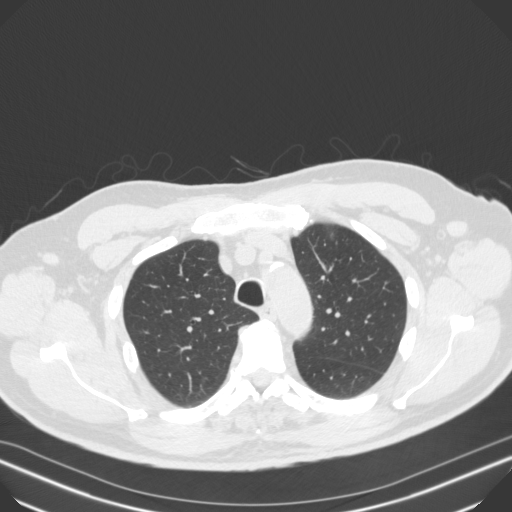
[im 125/157  mediastinal]
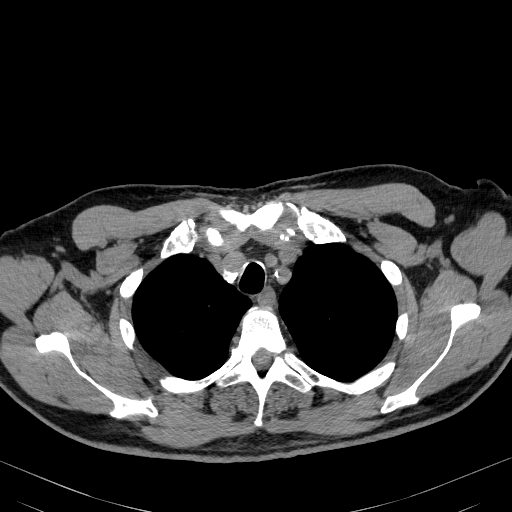
[im 125/157  lung]
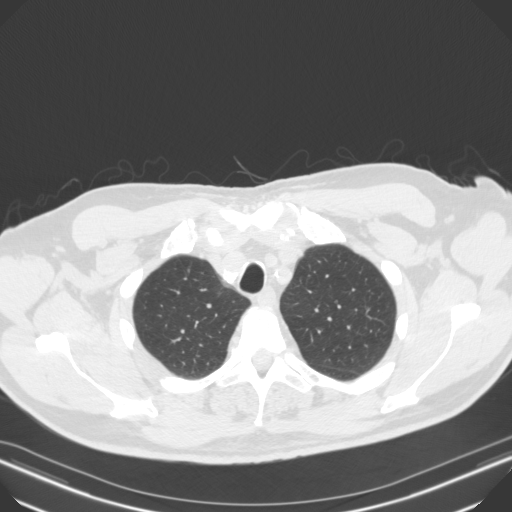
[im 133/157  lung]
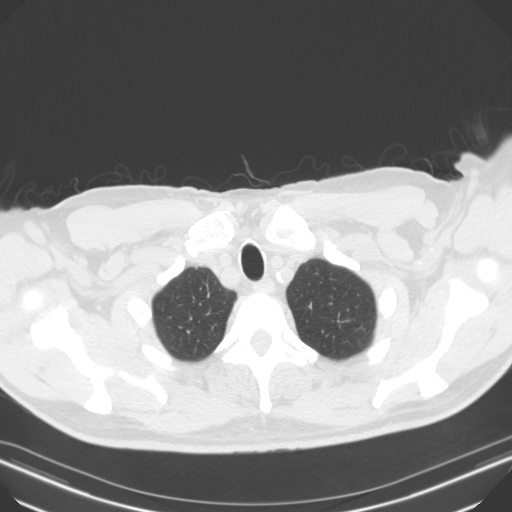
[im 145/157  lung]
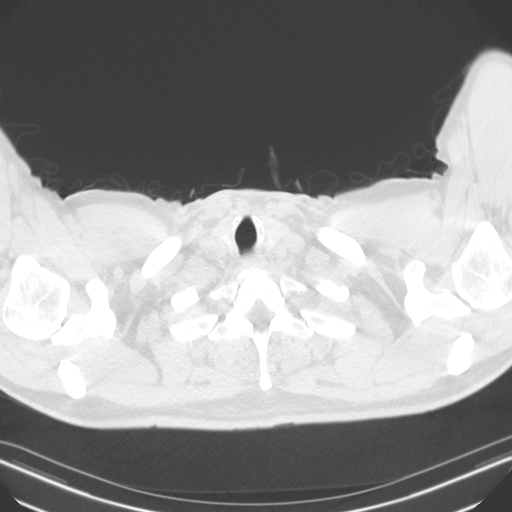

[15 of 34 positions shown; findings below may reference images not displayed]

FINDINGS: Cardiovascular: Extensive multi-vessel coronary artery calcification
is seen. Probable left anterior descending coronary artery stenting
has been performed. Global cardiac size within normal limits. No
pericardial effusion. The central pulmonary arteries are of normal
caliber. Mild atherosclerotic calcification within the thoracic
aorta. No aortic aneurysm.

Mediastinum/Nodes: No enlarged mediastinal or axillary lymph nodes.
Thyroid gland, trachea, and esophagus demonstrate no significant
findings.

Lungs/Pleura: The lungs are symmetrically well expanded. Nodular
infiltrate within the peripheral anteromedial and lateral segments
of the left lower lobe has improved in the interval since prior
examination, though has not yet completely resolved. Similarly,
peribronchial infiltrate within the basilar right middle lobe and
right lower lobe has resolved. Airway impaction has resolved.
Bronchial wall thickening is again noted centrally, in keeping with
airway inflammation, but has improved since prior examination. No
new focal pulmonary nodules or infiltrates. No pneumothorax or
pleural effusion. No central obstructing lesion identified.

Upper Abdomen: Cholelithiasis noted. Probable tiny cysts scattered
throughout the left hepatic lobe. No acute abnormality.

Musculoskeletal: No acute bone abnormality. No lytic or blastic bone
lesion.
IMPRESSION: Interval improvement in bibasilar pulmonary infiltrate, resolution
of airway impaction, and improvement in inflammatory airway changes.
Residual infiltrate persists within the left lower lobe.

Extensive multi-vessel coronary artery calcification

Aortic Atherosclerosis ([id number].O).

## 2023-04-28 LAB — LAB REPORT - SCANNED: EGFR: 67

## 2023-04-30 ENCOUNTER — Encounter: Payer: Self-pay | Admitting: Interventional Cardiology

## 2023-10-01 ENCOUNTER — Other Ambulatory Visit: Payer: Self-pay | Admitting: Physician Assistant

## 2023-10-03 ENCOUNTER — Encounter: Payer: Self-pay | Admitting: Cardiovascular Disease

## 2023-10-03 ENCOUNTER — Ambulatory Visit: Payer: Medicare Other | Attending: Cardiovascular Disease | Admitting: Cardiovascular Disease

## 2023-10-03 VITALS — BP 122/78 | HR 57 | Resp 16 | Ht 70.0 in | Wt 184.2 lb

## 2023-10-03 DIAGNOSIS — R0989 Other specified symptoms and signs involving the circulatory and respiratory systems: Secondary | ICD-10-CM | POA: Diagnosis present

## 2023-10-03 DIAGNOSIS — I2510 Atherosclerotic heart disease of native coronary artery without angina pectoris: Secondary | ICD-10-CM | POA: Insufficient documentation

## 2023-10-03 DIAGNOSIS — I10 Essential (primary) hypertension: Secondary | ICD-10-CM | POA: Insufficient documentation

## 2023-10-03 NOTE — Progress Notes (Signed)
Cardiology Office Note:    Date:  10/03/2023   ID:  Austin Odom, DOB December 08, 1945, MRN [id number]  PCP:  Noberto Retort, MD   [facility] Providers Cardiologist:  Tonny Bollman, MD     Referring MD: Noberto Retort, MD   Chief Complaint  Patient presents with   Essential hypertension   Coronary artery disease involving native coronary artery of   Follow-up    1 year    History of Present Illness:    Austin Odom is a 77 y.o. male with a hx of:  Coronary artery disease  S/p remote PCI in 1990s Known CTO of LAD Hx of PTCA to Dx branches Myoview 10/2019: no infarct or ischemia; low risk, EF 55 Hypertension  Hyperlipidemia  Hypothyroidism Aortic atherosclerosis  Peripheral arterial disease  AAA [place] 01/01/19: no AAA, prox aorta ectatic, Abnl dilation of R CIA; R CIA > 50 prox; L CIA and L EIA < 50 Abd aorta ectasia  Event monitor 3/15: NSR, sinus tachy  The patient is here alone today.  He has been doing really well.  He continues to play tennis competitively and has no exertional symptoms.  This is his main form of exercise.  He denies chest pain, chest pressure, or shortness of breath.  In the past when he experienced angina it felt like "indigestion" and he is not experiencing this any longer.  Current Medications: Current Meds  Medication Sig   amLODipine (NORVASC) 10 MG tablet TAKE 1 TABLET BY MOUTH EVERY DAY   ascorbic acid (VITAMIN C) 1000 MG tablet Take 1,000 mg by mouth daily.   aspirin EC 81 MG tablet Take 1 tablet (81 mg total) by mouth daily.   atorvastatin (LIPITOR) 80 MG tablet Take 80 mg by mouth daily.   cetirizine (ZYRTEC) 10 MG tablet Take 1 tablet by mouth daily as needed for allergies.    Cholecalciferol (VITAMIN D3) 125 MCG (5000 UT) CAPS Take 1 capsule by mouth daily.   Coenzyme Q10 (CO Q10) 100 MG CAPS Take 1 capsule by mouth daily.    fish oil-omega-3 fatty acids 1000 MG capsule Take 2 g by mouth daily.   fluticasone (FLONASE) 50  MCG/ACT nasal spray Place 2 sprays into both nostrils 2 (two) times daily as needed for allergies.    GLUCOSAMINE SULFATE PO Take 1 capsule by mouth daily. Taking 1000 daily   levothyroxine (SYNTHROID, LEVOTHROID) 100 MCG tablet Take 100 mcg by mouth daily before breakfast.    meloxicam (MOBIC) 7.5 MG tablet Take 1 tablet by mouth daily as needed for pain.    nitroGLYCERIN (NITROSTAT) 0.4 MG SL tablet Place 0.4 mg under the tongue as directed. Place 1 tablet under the tongue every 5 minutes as needed for chest pain (do not exceed 3 doses)     Allergies:   Patient has no known allergies.   ROS:   Please see the history of present illness.    All other systems reviewed and are negative.  EKGs/Labs/Other Studies Reviewed:    The following studies were reviewed today: Cardiac Studies & Procedures     STRESS TESTS  EXERCISE TOLERANCE TEST (ETT) 09/25/2022  Narrative   1-2 mm ST depression in the inferolateral leads that resolve with recovery. Along with good exercise capacity, would consider this low risk ETT              EKG:   EKG Interpretation Date/Time:  Friday October 03 2023 15:00:55 EST Ventricular Rate:  54 PR Interval:  150 QRS Duration:  88 QT Interval:  424 QTC Calculation: 402 R Axis:   53  Text Interpretation: Sinus bradycardia When compared with ECG of 05-May-2013 09:38, No significant change was found Confirmed by Tonny Bollman [phone]) on 10/03/2023 3:22:28 PM    Recent Labs: No results found for requested labs within last 365 days.  Recent Lipid Panel No results found for: "CHOL", "TRIG", "HDL", "CHOLHDL", "VLDL", "LDLCALC", "LDLDIRECT"   Risk Assessment/Calculations:                Physical Exam:    VS:  BP 122/78 (BP Location: Left Arm, Patient Position: Sitting, Cuff Size: Normal)   Pulse (!) 57   Resp 16   Ht 5\' 10"  (1.778 m)   Wt 184 lb 3.2 oz (83.6 kg)   SpO2 98%   BMI 26.43 kg/m     Wt Readings from Last 3 Encounters:  10/03/23 184  lb 3.2 oz (83.6 kg)  09/24/22 188 lb 3.2 oz (85.4 kg)  05/08/21 191 lb 3.2 oz (86.7 kg)     GEN:  Well nourished, well developed in no acute distress HEENT: Normal NECK: No JVD; Left carotid bruit LYMPHATICS: No lymphadenopathy CARDIAC: RRR, no murmurs, rubs, gallops RESPIRATORY:  Clear to auscultation without rales, wheezing or rhonchi  ABDOMEN: Soft, non-tender, non-distended MUSCULOSKELETAL:  No edema; No deformity  SKIN: Warm and dry NEUROLOGIC:  Alert and oriented x 3 PSYCHIATRIC:  Normal affect   Assessment & Plan Coronary artery disease involving native coronary artery of native heart without angina pectoris I reviewed the patient's exercise treadmill study from last year when he had 1 to 2 mm of ST depression at peak exercise that resolved quickly in recovery.  Felt to be a low risk study.  He has known chronic occlusion of the LAD.  This appears stable and he should continue on aspirin, amlodipine, and atorvastatin at current doses.  He is having no anginal symptoms at present.  I would like him to have a repeat stress study next year before his follow-up office visit. Essential hypertension Blood pressure is well-controlled on amlodipine.  Continue current regimen. Bruit of left carotid artery Recommend that we check a carotid duplex scan.  Continue medical therapy with aspirin and a high intensity statin drug.            Medication Adjustments/Labs and Tests Ordered: Current medicines are reviewed at length with the patient today.  Concerns regarding medicines are outlined above.  Orders Placed This Encounter  Procedures   EKG 12-Lead   No orders of the defined types were placed in this encounter.   There are no Patient Instructions on file for this visit.   Signed, Tonny Bollman, MD  10/03/2023 3:31 PM    [facility]

## 2023-10-03 NOTE — Assessment & Plan Note (Signed)
Blood pressure is well-controlled on amlodipine.  Continue current regimen.

## 2023-10-03 NOTE — Patient Instructions (Signed)
Testing/Procedures: Carotid Ultrasound Your physician has requested that you have a carotid duplex. This test is an ultrasound of the carotid arteries in your neck. It looks at blood flow through these arteries that supply the brain with blood. Allow one hour for this exam. There are no restrictions or special instructions.  Treadmill exercise stress test (prior to next year's visit) Your physician has requested that you have an exercise tolerance test. For further information please visit [email or web address] Please also follow instruction sheet, as given.  Follow-Up: At [facility], you and your health needs are our priority.  As part of our continuing mission to provide you with exceptional heart care, we have created designated Provider Care Teams.  These Care Teams include your primary Cardiologist (physician) and Advanced Practice Providers (APPs -  Physician Assistants and Nurse Practitioners) who all work together to provide you with the care you need, when you need it.  Your next appointment:   1 year(s)  Provider:   Tonny Bollman, MD       Other Instructions See separate sheet for stress test instuctions

## 2023-10-03 NOTE — Assessment & Plan Note (Signed)
I reviewed the patient's exercise treadmill study from last year when he had 1 to 2 mm of ST depression at peak exercise that resolved quickly in recovery.  Felt to be a low risk study.  He has known chronic occlusion of the LAD.  This appears stable and he should continue on aspirin, amlodipine, and atorvastatin at current doses.  He is having no anginal symptoms at present.  I would like him to have a repeat stress study next year before his follow-up office visit.

## 2023-10-10 LAB — LAB REPORT - SCANNED: EGFR: 75

## 2023-10-22 ENCOUNTER — Encounter (HOSPITAL_COMMUNITY): Payer: Medicare Other

## 2023-10-22 ENCOUNTER — Telehealth: Payer: Self-pay

## 2023-10-22 DIAGNOSIS — E785 Hyperlipidemia, unspecified: Secondary | ICD-10-CM

## 2023-10-22 MED ORDER — EZETIMIBE 10 MG PO TABS: 10.0000 mg | ORAL_TABLET | Freq: Every day | ORAL | 3 refills | Status: DC

## 2023-10-22 NOTE — Telephone Encounter (Signed)
-----   Message from Tonny Bollman sent at 10/19/2023  5:59 PM EST ----- Outside labs reviewed.  LDL cholesterol is 87.  This is above goal of less than 70 mg/dL.  Reviewed his medications and he is on atorvastatin 80 mg daily.  Would add Zetia 10 mg daily and repeat lipids and LFTs in 3 months.  Thanks

## 2023-10-22 NOTE — Telephone Encounter (Signed)
Spoke with patient concerning Dr Earmon Phoenix recommendation on recent labs. Patient agrees with adding zetia 10 mg once daily and repeat labs on 01/26/24 at [address] office - no further needs at this time.

## 2023-10-31 ENCOUNTER — Ambulatory Visit (HOSPITAL_COMMUNITY)
Admission: RE | Admit: 2023-10-31 | Discharge: 2023-10-31 | Disposition: A | Discharge status: Home / Self Care | Payer: Medicare Other | Source: Ambulatory Visit | Attending: Cardiovascular Disease | Admitting: Cardiovascular Disease

## 2023-10-31 DIAGNOSIS — I2510 Atherosclerotic heart disease of native coronary artery without angina pectoris: Secondary | ICD-10-CM | POA: Diagnosis present

## 2023-10-31 DIAGNOSIS — I10 Essential (primary) hypertension: Secondary | ICD-10-CM | POA: Diagnosis present

## 2023-10-31 DIAGNOSIS — R0989 Other specified symptoms and signs involving the circulatory and respiratory systems: Secondary | ICD-10-CM | POA: Insufficient documentation

## 2024-01-26 ENCOUNTER — Other Ambulatory Visit: Payer: Medicare Other

## 2024-02-23 ENCOUNTER — Ambulatory Visit: Attending: Cardiovascular Disease

## 2024-02-23 ENCOUNTER — Encounter: Payer: Self-pay | Admitting: Cardiovascular Disease

## 2024-02-23 DIAGNOSIS — R002 Palpitations: Secondary | ICD-10-CM

## 2024-02-23 NOTE — Telephone Encounter (Signed)
 I called the pt re: his My Chart message... pt says he has been having a "skipped heart beat" sensation over the past couple of months.. he only notices it at night when he lays down to sleep. He is very active during the day and does not have any other symptoms... no dizziness, no SOB, no pain... he says it lasts until he drops off to sleep.   His last TSH with his PCP 09/2023 WNL.. 3.56.   He is hydrating well and not taking any new OTC meds.   I will send to Dr Excell Seltzer for his review and recommendations.   Pt not due for f/u until Fall 2025.

## 2024-02-23 NOTE — Progress Notes (Unsigned)
 Enrolled for [employer] to mail a ZIO XT long term holter monitor to the patients address on file.

## 2024-02-23 NOTE — Telephone Encounter (Signed)
 Py verbalized understanding of his Zio patch instructions.

## 2024-03-28 DIAGNOSIS — R002 Palpitations: Secondary | ICD-10-CM | POA: Diagnosis not present

## 2024-03-30 ENCOUNTER — Ambulatory Visit: Payer: Self-pay

## 2024-04-04 ENCOUNTER — Other Ambulatory Visit: Payer: Self-pay | Admitting: Physician Assistant

## 2024-09-16 ENCOUNTER — Encounter (HOSPITAL_COMMUNITY): Payer: Self-pay | Admitting: *Deleted

## 2024-10-01 ENCOUNTER — Ambulatory Visit (HOSPITAL_COMMUNITY)
Admission: RE | Admit: 2024-10-01 | Discharge: 2024-10-01 | Disposition: A | Discharge status: Home / Self Care | Source: Ambulatory Visit | Attending: Cardiovascular Disease | Admitting: Cardiovascular Disease

## 2024-10-01 DIAGNOSIS — I2510 Atherosclerotic heart disease of native coronary artery without angina pectoris: Secondary | ICD-10-CM | POA: Insufficient documentation

## 2024-10-01 DIAGNOSIS — R0989 Other specified symptoms and signs involving the circulatory and respiratory systems: Secondary | ICD-10-CM | POA: Insufficient documentation

## 2024-10-01 DIAGNOSIS — I10 Essential (primary) hypertension: Secondary | ICD-10-CM | POA: Insufficient documentation

## 2024-10-01 LAB — EXERCISE TOLERANCE TEST
Angina Index: 0
Duke Treadmill Score: 9
Estimated workload: 10.1
Exercise duration (min): 9 min
Exercise duration (sec): 0 s
MPHR: 141 {beats}/min
Peak HR: 130 {beats}/min
Percent HR: 92 %
RPE: 18
Rest HR: 55 {beats}/min
ST Depression (mm): 0 mm

## 2024-10-03 ENCOUNTER — Ambulatory Visit: Payer: Self-pay | Admitting: Cardiovascular Disease

## 2024-10-06 ENCOUNTER — Encounter: Payer: Self-pay | Admitting: Emergency Medicine

## 2024-10-06 ENCOUNTER — Ambulatory Visit: Admission: EM | Admit: 2024-10-06 | Discharge: 2024-10-06 | Disposition: A | Discharge status: Home / Self Care

## 2024-10-06 DIAGNOSIS — J0191 Acute recurrent sinusitis, unspecified: Secondary | ICD-10-CM

## 2024-10-06 MED ORDER — DOXYCYCLINE HYCLATE 100 MG PO CAPS: 100.0000 mg | ORAL_CAPSULE | Freq: Two times a day (BID) | ORAL | 0 refills | Status: AC

## 2024-10-06 NOTE — Discharge Instructions (Addendum)

## 2024-10-06 NOTE — ED Provider Notes (Signed)
 [facility]    CSN: [id number] Arrival date & time: 10/06/24  1336      History   Chief Complaint Chief Complaint  Patient presents with   Facial Swelling   Facial Pain    HPI Austin Odom is a 78 y.o. male.   Patient presents today due to 2 days worth of edema and pain over right maxillary sinus.  Patient states that he has been using ibuprofen, ibuprofen, Tylenol, and Zyrtec with some relief of symptoms.  Patient denies dental pain, fever, or purulent nasal drainage.  The history is provided by the patient.    Past Medical History:  Diagnosis Date   Aortic atherosclerosis    Coronary artery disease    s/p remote PCI in 1990s // known CTO of LAD // hx of PTCA to Dx branches // Myoview  10/2019: low risk // ETT 09/2022: low risk (9', 10.1 METs)   HLD (hyperlipidemia)    HTN (hypertension)    Hypothyroidism     Patient Active Problem List   Diagnosis Date Noted   Ectatic abdominal aorta 09/24/2022   PAD (peripheral artery disease) 09/24/2022   Essential hypertension 09/23/2022   Aortic atherosclerosis 09/23/2022   Lumbar radiculopathy 09/19/2022   Arthralgia of right elbow 03/17/2020   Lateral epicondylitis of right elbow 03/17/2020   Pain in right knee 01/31/2020   Degeneration of lumbar intervertebral disc 04/01/2019   Coronary artery disease involving native coronary artery of native heart without angina pectoris 08/30/2012   Hyperlipidemia 05/31/2012    Past Surgical History:  Procedure Laterality Date   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 Authorizing Provider  amLODipine  (NORVASC ) 10 MG tablet TAKE 1 TABLET BY MOUTH EVERY DAY 04/05/24  Yes Weaver, Scott T, PA-C  ammonium lactate (LAC-HYDRIN) 12 % lotion Apply topically. 05/06/24  Yes [provider]  aspirin  EC 81 MG tablet Take 1 tablet (81 mg total) by mouth daily. 08/20/17  Yes Cooper, Michael, MD  atorvastatin (LIPITOR) 80 MG tablet Take 80 mg by mouth daily.   Yes [provider]  cetirizine (ZYRTEC) 10 MG tablet Take 1 tablet by mouth daily as needed for allergies.  12/22/14  Yes [provider]  Cholecalciferol (VITAMIN D3) 125 MCG (5000 UT) CAPS Take 1 capsule by mouth daily.   Yes [provider]  Coenzyme Q10 (CO Q10) 100 MG CAPS Take 1 capsule by mouth daily.    Yes [provider]  doxycycline  (VIBRAMYCIN ) 100 MG capsule Take 1 capsule (100 mg total) by mouth 2 (two) times daily for 7 days. 10/06/24 10/13/24 Yes Andra Corean BROCKS, PA-C  ezetimibe  (ZETIA ) 10 MG tablet Take 1 tablet (10 mg total) by mouth daily. 10/22/23 10/06/24 Yes Wonda Sharper, MD  fish oil-omega-3 fatty acids 1000 MG capsule Take 2 g by mouth daily.   Yes [provider]  GLUCOSAMINE SULFATE PO Take 1 capsule by mouth daily. Taking 1000 daily  Yes [provider]  levothyroxine (SYNTHROID, LEVOTHROID) 100 MCG tablet Take 100 mcg by mouth daily before breakfast.    Yes [provider]  ascorbic acid (VITAMIN C) 1000 MG tablet Take 1,000 mg by mouth daily.    [provider]  fluticasone (FLONASE) 50 MCG/ACT nasal spray Place 2 sprays into both nostrils 2 (two) times daily as needed for allergies.  Patient not taking: Reported on 10/06/2024 03/15/13   [provider]  meloxicam (MOBIC) 7.5 MG tablet Take 1 tablet by mouth daily as needed for pain.  Patient  not taking: Reported on 10/06/2024 12/22/14   [provider]  methocarbamol (ROBAXIN) 500 MG tablet Take 500 mg by mouth 2 (two) times daily as needed. Patient not taking: Reported on 10/06/2024 04/26/24   [provider]  nitroGLYCERIN (NITROSTAT) 0.4 MG SL tablet Place 0.4 mg under the tongue as directed. Place 1 tablet under the tongue every 5 minutes as needed for chest pain (do not exceed 3 doses) Patient not taking: Reported on 10/06/2024 12/20/15   [provider]  promethazine-dextromethorphan (PROMETHAZINE-DM) 6.25-15 MG/5ML syrup Take 5 mLs by mouth every 6 (six) hours as needed. 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   Review of Systems Review of Systems   Physical Exam Triage Vital Signs ED Triage Vitals  Encounter Vitals Group     BP 10/06/24 1528 (!) 171/35     Girls Systolic BP Percentile --      Girls Diastolic BP Percentile --      Boys Systolic BP Percentile --      Boys Diastolic BP Percentile --      Pulse Rate 10/06/24 1528 (!) 54     Resp 10/06/24 1528 18     Temp 10/06/24 1528 (!) 97.5 F (36.4 C)     Temp Source 10/06/24 1528 Oral     SpO2 10/06/24 1528 95 %     Weight --      Height --      Head Circumference --      Peak Flow --      Pain Score 10/06/24 1529 3     Pain Loc --      Pain Education --      Exclude from Growth Chart --    No data found.  Updated Vital Signs BP (!) 160/73 (BP Location: Left Arm)   Pulse (!) 54   Temp (!) 97.5 F (36.4 C) (Oral)   Resp 18   SpO2 95%    Visual Acuity Right Eye Distance:   Left Eye Distance:   Bilateral Distance:    Right Eye Near:   Left Eye Near:    Bilateral Near:     Physical Exam Vitals and nursing note reviewed.  Constitutional:      General: He is not in acute distress.    Appearance: Normal appearance. He is not ill-appearing, toxic-appearing or diaphoretic.  HENT:  Nose: Congestion (moderately enlarged turbinates) present. No rhinorrhea.     Right Sinus: Maxillary sinus tenderness present. No frontal sinus tenderness.     Left Sinus: No maxillary sinus tenderness or frontal sinus tenderness.     Comments: No tenderness to palpation of upper lip, mild edema noted over right  maxillary sinus Eyes:     General: No scleral icterus. Cardiovascular:     Rate and Rhythm: Normal rate and regular rhythm.     Heart sounds: Normal heart sounds.  Pulmonary:     Effort: Pulmonary effort is normal. No respiratory distress.     Breath sounds: Normal breath sounds. No wheezing or rhonchi.  Skin:    General: Skin is warm.  Neurological:     Mental Status: He is alert and oriented to person, place, and time.  Psychiatric:        Mood and Affect: Mood normal.        Behavior: Behavior normal.      UC Treatments / Results  Labs (all labs ordered are listed, but only abnormal results are displayed) Labs Reviewed - No data to display  EKG   Radiology No results found.  Procedures Procedures (including critical care time)  Medications Ordered in UC Medications - No data to display  Initial Impression / Assessment and Plan / UC Course  I have reviewed the triage vital signs and the nursing notes.  Pertinent labs & imaging results that were available during my care of the patient were reviewed by me and considered in my medical decision making (see chart for details).   Final Clinical Impressions(s) / UC Diagnoses   Final diagnoses:  Acute recurrent sinusitis, unspecified location      Discharge Instructions      You have been diagnosed with a sinus infection today, some are caused by viruses and others are caused by bacteria.  If your symptoms have been going on for less than 7 days it is most likely that you have a viral sinus infection.  Antibiotics will not work for this and it will have to run its course.  Sinus rinses (using a Nettie pot) or saline rinses are helpful as well as pseudoephedrine, and nasal sprays along with ibuprofen and Tylenol for pain.  If you have had your symptoms for more than 7 days you most likely have a bacterial infection and will be prescribed antibiotics.  Supportive measures given for viral sinus infections will also be helpful for bacterial infections.  If you are using antibiotics you should start to feel better in 2 to 3 days but it is important that you complete antibiotics in their entirety.     ED Prescriptions     Medication Sig Dispense Auth. Provider   doxycycline (VIBRAMYCIN) 100 MG capsule Take 1 capsule (100 mg total) by mouth 2 (two) times daily for 7 days. 14 capsule Andra Corean BROCKS, PA-C      PDMP not reviewed this encounter.   Andra Corean BROCKS, PA-C 10/06/24 [phone]

## 2024-10-06 NOTE — ED Triage Notes (Signed)
 Pt reports facial swelling and pain directly under R eye down to his cheek bone x2 days. Skin is discolored to area with slight swelling. Area is tender to touch and sore when he squints the eye. Denies eye pain, vision changes, and photosensitivity. Advil gives some relief from soreness. No injury to this area. Denies new meds, foods, detergents, or soaps.

## 2024-10-07 ENCOUNTER — Other Ambulatory Visit: Payer: Self-pay | Admitting: Physician Assistant

## 2024-10-24 NOTE — Progress Notes (Unsigned)
 Cardiology Office Note:    Date:  10/25/2024   ID:  Austin Odom, DOB 1946-03-21, MRN [id number]  PCP:  Austin Elsie SAUNDERS, MD   [facility] Providers Cardiologist:  Austin Fell, MD     Referring MD: Austin Elsie SAUNDERS, MD   Chief Complaint  Patient presents with   Coronary Artery Disease    History of Present Illness:    Austin Odom is a 78 y.o. male with a hx of:  Coronary artery disease  S/p remote PCI in 1990s Known CTO of LAD Hx of PTCA to Dx branches Myoview  10/2019: no infarct or ischemia; low risk, EF 55 Hypertension  Hyperlipidemia  Hypothyroidism Aortic atherosclerosis  Peripheral arterial disease  AAA US  01/01/19: no AAA, prox aorta ectatic, Abnl dilation of R CIA; R CIA > 50 prox; L CIA and L EIA < 50 Abd aorta ectasia  Event monitor 3/15: NSR, sinus tachy. 2025 monitor sinus rhythm, PVC burden 4.5%.  The patient is here alone today. Still playing tennis regularly and walking without exertional symptoms. He has his annual Wellness visit coming up next week with Dr Austin with planned labs at that time.  Today, we talked about how he played football at [employer] from [phone].  He has been very active his entire life.   Current Medications: Current Meds  Medication Sig   ammonium lactate (LAC-HYDRIN) 12 % lotion Apply topically.   ascorbic acid (VITAMIN C) 1000 MG tablet Take 1,000 mg by mouth daily.   aspirin  EC 81 MG tablet Take 1 tablet (81 mg total) by mouth daily.   atorvastatin (LIPITOR) 80 MG tablet Take 80 mg by mouth daily.   cetirizine (ZYRTEC) 10 MG tablet Take 1 tablet by mouth daily as needed for allergies.    Cholecalciferol (VITAMIN D3) 125 MCG (5000 UT) CAPS Take 1 capsule by mouth daily.   Coenzyme Q10 (CO Q10) 100 MG CAPS Take 1 capsule by mouth daily.    fish oil-omega-3 fatty acids 1000 MG capsule Take 2 g by mouth daily.   fluticasone (FLONASE) 50 MCG/ACT nasal spray Place 2 sprays into both nostrils 2 (two) times  daily as needed for allergies.    GLUCOSAMINE SULFATE PO Take 1 capsule by mouth daily. Taking 1000 daily   levothyroxine (SYNTHROID, LEVOTHROID) 100 MCG tablet Take 100 mcg by mouth daily before breakfast.    meloxicam (MOBIC) 7.5 MG tablet Take 1 tablet by mouth daily as needed for pain.    methocarbamol (ROBAXIN) 500 MG tablet Take 500 mg by mouth 2 (two) times daily as needed.   nitroGLYCERIN (NITROSTAT) 0.4 MG SL tablet Place 0.4 mg under the tongue as directed. Place 1 tablet under the tongue every 5 minutes as needed for chest pain (do not exceed 3 doses)   promethazine-dextromethorphan (PROMETHAZINE-DM) 6.25-15 MG/5ML syrup Take 5 mLs by mouth every 6 (six) hours as needed.   [DISCONTINUED] amLODipine  (NORVASC ) 10 MG tablet TAKE 1 TABLET BY MOUTH EVERY DAY   [DISCONTINUED] ezetimibe  (ZETIA ) 10 MG tablet Take 1 tablet (10 mg total) by mouth daily.     Allergies:   Patient has no known allergies.   ROS:   Please see the history of present illness.    All other systems reviewed and are negative.  EKGs/Labs/Other Studies Reviewed:    The following studies were reviewed today: Cardiac Studies & Procedures   ______________________________________________________________________________________________   STRESS TESTS  EXERCISE TOLERANCE TEST (ETT) 10/01/2024  Interpretation Summary   The ECG  was negative for ischemia.   A Bruce protocol stress test was performed. Exercise capacity was excellent. Patient exercised for 9 min and 0 sec. Maximum HR of 130 bpm. MPHR 92.0%. Peak METS 10.1. The patient experienced no angina during the test. The test was stopped because the patient experienced fatigue. The patient reported no symptoms during the stress test. Normal blood pressure and normal heart rate response noted during stress. Heart rate recovery was normal.   Prior study available for comparison. No changes compared to prior study. Upsloping ST depressions are seen on both studies, which  is nonspecific for ischemia.      MONITORS  LONG TERM MONITOR (3-14 DAYS) 03/18/2024  Narrative Patch Wear Time:  6 days and 6 hours (2025-04-20T19:04:11-398 to 2025-04-27T01:33:46-0400)  Patient had a min HR of 44 bpm, max HR of 182 bpm, and avg HR of 61 bpm. Predominant underlying rhythm was Sinus Rhythm. 11 Supraventricular Tachycardia runs occurred, the run with the fastest interval lasting 12 beats with a max rate of 182 bpm (avg 165 bpm); the run with the fastest interval was also the longest. Isolated SVEs were rare (<1.0%), SVE Couplets were rare (<1.0%), and SVE Triplets were rare (<1.0%). Isolated VEs were occasional (4.5%, 11686), VE Couplets were rare (<1.0%, 111), and no VE Triplets were present. Ventricular Bigeminy and Trigeminy were present.  SUMMARY: sinus rhythm with an average HR of 61 bpm. 11 short SVT runs occurred, the longest is 12 beats. Occasional PVC's with a burden of 4.5%. No sustained arrhythmias.       ______________________________________________________________________________________________      EKG:        Recent Labs: No results found for requested labs within last 365 days.  Recent Lipid Panel No results found for: CHOL, TRIG, HDL, CHOLHDL, VLDL, LDLCALC, LDLDIRECT   Risk Assessment/Calculations:                Physical Exam:    VS:  BP (!) 126/54   Pulse (!) 51   Ht 5' 11 (1.803 m)   Wt 187 lb 6.4 oz (85 kg)   SpO2 97%   BMI 26.14 kg/m     Wt Readings from Last 3 Encounters:  10/25/24 187 lb 6.4 oz (85 kg)  10/03/23 184 lb 3.2 oz (83.6 kg)  09/24/22 188 lb 3.2 oz (85.4 kg)     GEN:  Well nourished, well developed in no acute distress HEENT: Normal NECK: No JVD; No carotid bruits LYMPHATICS: No lymphadenopathy CARDIAC: RRR, no murmurs, rubs, gallops RESPIRATORY:  Clear to auscultation without rales, wheezing or rhonchi  ABDOMEN: Soft, non-tender, non-distended MUSCULOSKELETAL:  No edema; No deformity   SKIN: Warm and dry NEUROLOGIC:  Alert and oriented x 3 PSYCHIATRIC:  Normal affect   Assessment & Plan Coronary artery disease involving native coronary artery of native heart without angina pectoris The patient remains clinically stable with no anginal symptoms.  He recently had a low risk exercise treadmill stress study.  He continues on aspirin  for antiplatelet therapy and a high intensity statin drug.  The patient has known chronic occlusion of the LAD and his last perfusion study from 2020 is reviewed when he had no significant ischemia and normal LVEF.  I would like to see him back in 1 year with an exercise treadmill study prior to that visit. Essential hypertension Blood pressure is under optimal control.  Continue amlodipine  10 mg daily. Mixed hyperlipidemia Treated with atorvastatin and ezetimibe .  Last lipids showed an LDL cholesterol of  52.  Continue current management.       Informed Consent   Shared Decision Making/Informed Consent The risks [chest pain, shortness of breath, cardiac arrhythmias, dizziness, blood pressure fluctuations, myocardial infarction, stroke/transient ischemic attack, and life-threatening complications (estimated to be 1 in 10,000)], benefits (risk stratification, diagnosing coronary artery disease, treatment guidance) and alternatives of an exercise tolerance test were discussed in detail with Mr. Shader and he agrees to proceed.       Medication Adjustments/Labs and Tests Ordered: Current medicines are reviewed at length with the patient today.  Concerns regarding medicines are outlined above.  Orders Placed This Encounter  Procedures   Cardiac Stress Test: Informed Consent Details: Physician/Practitioner Attestation; Transcribe to consent form and obtain patient signature   Exercise Tolerance Test   Meds ordered this encounter  Medications   amLODipine  (NORVASC ) 10 MG tablet    Sig: Take 1 tablet (10 mg total) by mouth daily.    Dispense:  90  tablet    Refill:  3   ezetimibe  (ZETIA ) 10 MG tablet    Sig: Take 1 tablet (10 mg total) by mouth daily.    Dispense:  90 tablet    Refill:  3    Patient Instructions  Medication Instructions:  Your physician recommends that you continue on your current medications as directed. Please refer to the Current Medication list given to you today.  *If you need a refill on your cardiac medications before your next appointment, please call your pharmacy*  Testing/Procedures: Your physician has requested that you have an Exercise Stress Test - right before 1 year f/u. An exercise tolerance test is a test to check how your heart works during exercise. You will need to walk on a treadmill for this test. An electrocardiogram (ECG) will record your heartbeat when you are at rest and when you are exercising.    Please arrive 15 minutes prior to your appointment time for registration and insurance purposes.   The test will take approximately 45 minutes to complete.   How to prepare for your Exercise Stress Test: Do bring a list of your current medications with you.  If not listed below, you may take your medications as normal. Do wear comfortable clothes (no dresses or overalls) and walking shoes, tennis shoes preferred (no heels or open toed shoes are allowed) Do Not wear cologne, perfume, aftershave or lotions (deodorant is allowed). Do not drink or eat foods with caffeine for 24 hours before the test. (Chocolate, coffee, tea, or energy drinks) If you use an inhaler, bring it with you to the test. Do not smoke for 4 hours before the test.    If these instructions are not followed, your test will have to be rescheduled.   If you cannot keep your appointment, please provide 24 hours notification to our office, to avoid a possible $50 charge to your account.     Follow-Up: At [facility], you and your health needs are our priority.  As part of our continuing mission to provide you  with exceptional heart care, our providers are all part of one team.  This team includes your primary Cardiologist (physician) and Advanced Practice Providers or APPs (Physician Assistants and Nurse Practitioners) who all work together to provide you with the care you need, when you need it.  Your next appointment:   1 year(s)  Provider:   Ozell Fell, MD      Signed, Austin Fell, MD  10/25/2024 12:39 PM  [facility]

## 2024-10-25 ENCOUNTER — Ambulatory Visit: Attending: Cardiovascular Disease | Admitting: Cardiovascular Disease

## 2024-10-25 ENCOUNTER — Encounter: Payer: Self-pay | Admitting: Cardiovascular Disease

## 2024-10-25 VITALS — BP 126/54 | HR 51 | Ht 71.0 in | Wt 187.4 lb

## 2024-10-25 DIAGNOSIS — I10 Essential (primary) hypertension: Secondary | ICD-10-CM

## 2024-10-25 DIAGNOSIS — E782 Mixed hyperlipidemia: Secondary | ICD-10-CM

## 2024-10-25 DIAGNOSIS — I2510 Atherosclerotic heart disease of native coronary artery without angina pectoris: Secondary | ICD-10-CM

## 2024-10-25 MED ORDER — EZETIMIBE 10 MG PO TABS: 10.0000 mg | ORAL_TABLET | Freq: Every day | ORAL | 3 refills | Status: AC

## 2024-10-25 MED ORDER — AMLODIPINE BESYLATE 10 MG PO TABS: 10.0000 mg | ORAL_TABLET | Freq: Every day | ORAL | 3 refills | Status: AC

## 2024-10-25 NOTE — Patient Instructions (Signed)
 Medication Instructions:  Your physician recommends that you continue on your current medications as directed. Please refer to the Current Medication list given to you today.  *If you need a refill on your cardiac medications before your next appointment, please call your pharmacy*  Testing/Procedures: Your physician has requested that you have an Exercise Stress Test - right before 1 year f/u. An exercise tolerance test is a test to check how your heart works during exercise. You will need to walk on a treadmill for this test. An electrocardiogram (ECG) will record your heartbeat when you are at rest and when you are exercising.    Please arrive 15 minutes prior to your appointment time for registration and insurance purposes.   The test will take approximately 45 minutes to complete.   How to prepare for your Exercise Stress Test: Do bring a list of your current medications with you.  If not listed below, you may take your medications as normal. Do wear comfortable clothes (no dresses or overalls) and walking shoes, tennis shoes preferred (no heels or open toed shoes are allowed) Do Not wear cologne, perfume, aftershave or lotions (deodorant is allowed). Do not drink or eat foods with caffeine for 24 hours before the test. (Chocolate, coffee, tea, or energy drinks) If you use an inhaler, bring it with you to the test. Do not smoke for 4 hours before the test.    If these instructions are not followed, your test will have to be rescheduled.   If you cannot keep your appointment, please provide 24 hours notification to our office, to avoid a possible $50 charge to your account.     Follow-Up: At [facility], you and your health needs are our priority.  As part of our continuing mission to provide you with exceptional heart care, our providers are all part of one team.  This team includes your primary Cardiologist (physician) and Advanced Practice Providers or APPs (Physician  Assistants and Nurse Practitioners) who all work together to provide you with the care you need, when you need it.  Your next appointment:   1 year(s)  Provider:   Ozell Fell, MD

## 2024-10-25 NOTE — Assessment & Plan Note (Signed)
 Treated with atorvastatin and ezetimibe .  Last lipids showed an LDL cholesterol of 52.  Continue current management.

## 2024-10-25 NOTE — Assessment & Plan Note (Signed)
 The patient remains clinically stable with no anginal symptoms.  He recently had a low risk exercise treadmill stress study.  He continues on aspirin  for antiplatelet therapy and a high intensity statin drug.  The patient has known chronic occlusion of the LAD and his last perfusion study from 2020 is reviewed when he had no significant ischemia and normal LVEF.  I would like to see him back in 1 year with an exercise treadmill study prior to that visit.

## 2024-10-25 NOTE — Assessment & Plan Note (Signed)
 Blood pressure is under optimal control.  Continue amlodipine  10 mg daily.

## 2024-11-04 ENCOUNTER — Other Ambulatory Visit: Payer: Self-pay | Admitting: Physician Assistant

## 2024-11-29 ENCOUNTER — Encounter: Payer: Self-pay | Admitting: Cardiovascular Disease

## 2024-12-16 ENCOUNTER — Other Ambulatory Visit: Payer: Self-pay | Admitting: Family Medicine

## 2024-12-16 DIAGNOSIS — R110 Nausea: Secondary | ICD-10-CM

## 2024-12-17 ENCOUNTER — Ambulatory Visit
Admission: RE | Admit: 2024-12-17 | Discharge: 2024-12-17 | Disposition: A | Source: Ambulatory Visit | Attending: Family Medicine | Admitting: Family Medicine

## 2024-12-17 DIAGNOSIS — R110 Nausea: Secondary | ICD-10-CM

## 2024-12-17 MED ORDER — IOPAMIDOL (ISOVUE-300) INJECTION 61%
100.0000 mL | Freq: Once | INTRAVENOUS | Status: AC | PRN
  Administered 2024-12-17: 100 mL via INTRAVENOUS

## 2025-09-29 ENCOUNTER — Encounter (HOSPITAL_COMMUNITY)
